# Patient Record
Sex: Male | Born: 1950 | Race: Black or African American | Hispanic: No | Marital: Married | Smoking: Never smoker
Health system: Southern US, Community
[De-identification: ages and names within clinical notes are randomized; demographics above are authoritative.]

## PROBLEM LIST (undated history)

## (undated) DIAGNOSIS — I10 Essential (primary) hypertension: Secondary | ICD-10-CM

## (undated) DIAGNOSIS — K219 Gastro-esophageal reflux disease without esophagitis: Secondary | ICD-10-CM

## (undated) DIAGNOSIS — M1990 Unspecified osteoarthritis, unspecified site: Secondary | ICD-10-CM

## (undated) HISTORY — PX: HERNIA REPAIR: SHX51

---

## 2004-10-05 ENCOUNTER — Emergency Department (HOSPITAL_COMMUNITY): Admission: EM | Admit: 2004-10-05 | Discharge: 2004-10-05 | Payer: Self-pay | Admitting: Emergency Medicine

## 2006-05-14 ENCOUNTER — Encounter: Admission: RE | Admit: 2006-05-14 | Discharge: 2006-05-14 | Payer: Self-pay | Admitting: Family Medicine

## 2008-02-15 ENCOUNTER — Inpatient Hospital Stay (HOSPITAL_COMMUNITY): Admission: EM | Admit: 2008-02-15 | Discharge: 2008-02-16 | Payer: Self-pay | Admitting: Emergency Medicine

## 2008-02-16 ENCOUNTER — Encounter (INDEPENDENT_AMBULATORY_CARE_PROVIDER_SITE_OTHER): Payer: Self-pay | Admitting: *Deleted

## 2009-05-16 IMAGING — CR DG CHEST 2V
2 series · 2 of 2 positions shown · non-contrast
Comparison: None

CLINICAL DATA: Chest pain

CHEST - 2 VIEW

[view not recorded (1 of 2)]
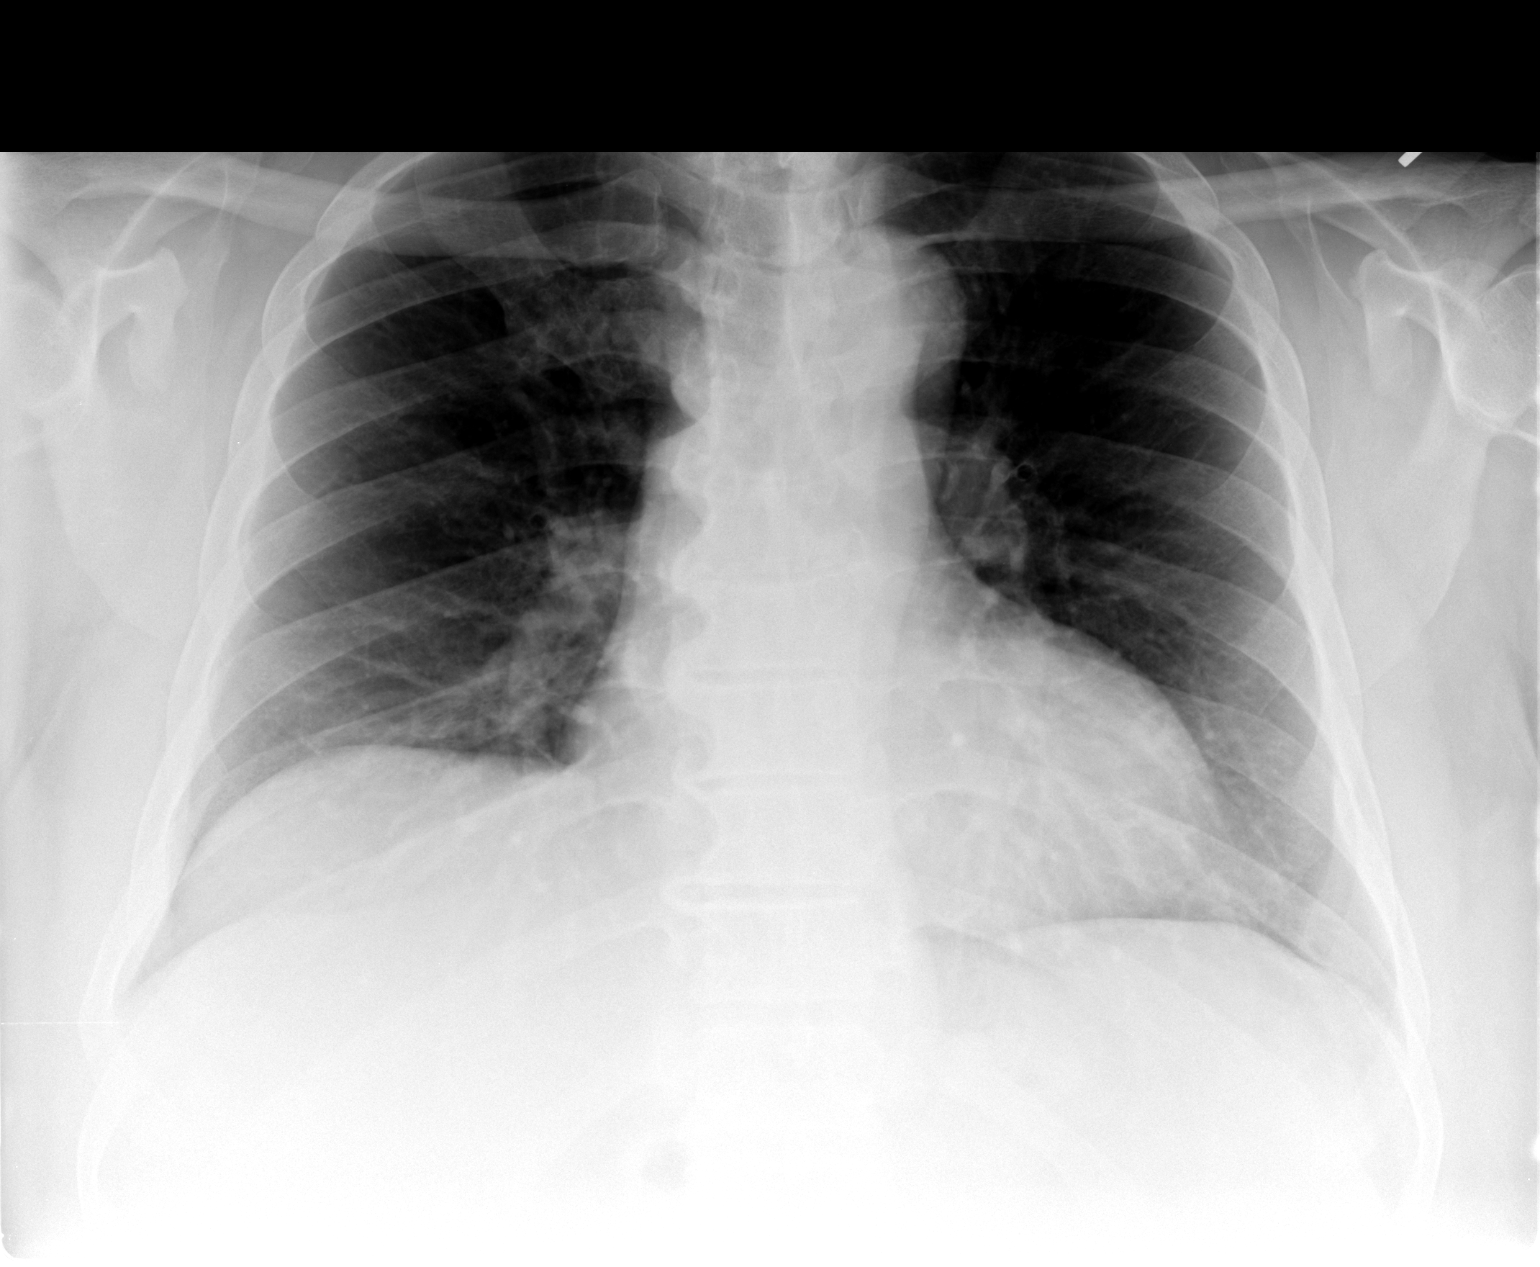

[view not recorded (2 of 2)]
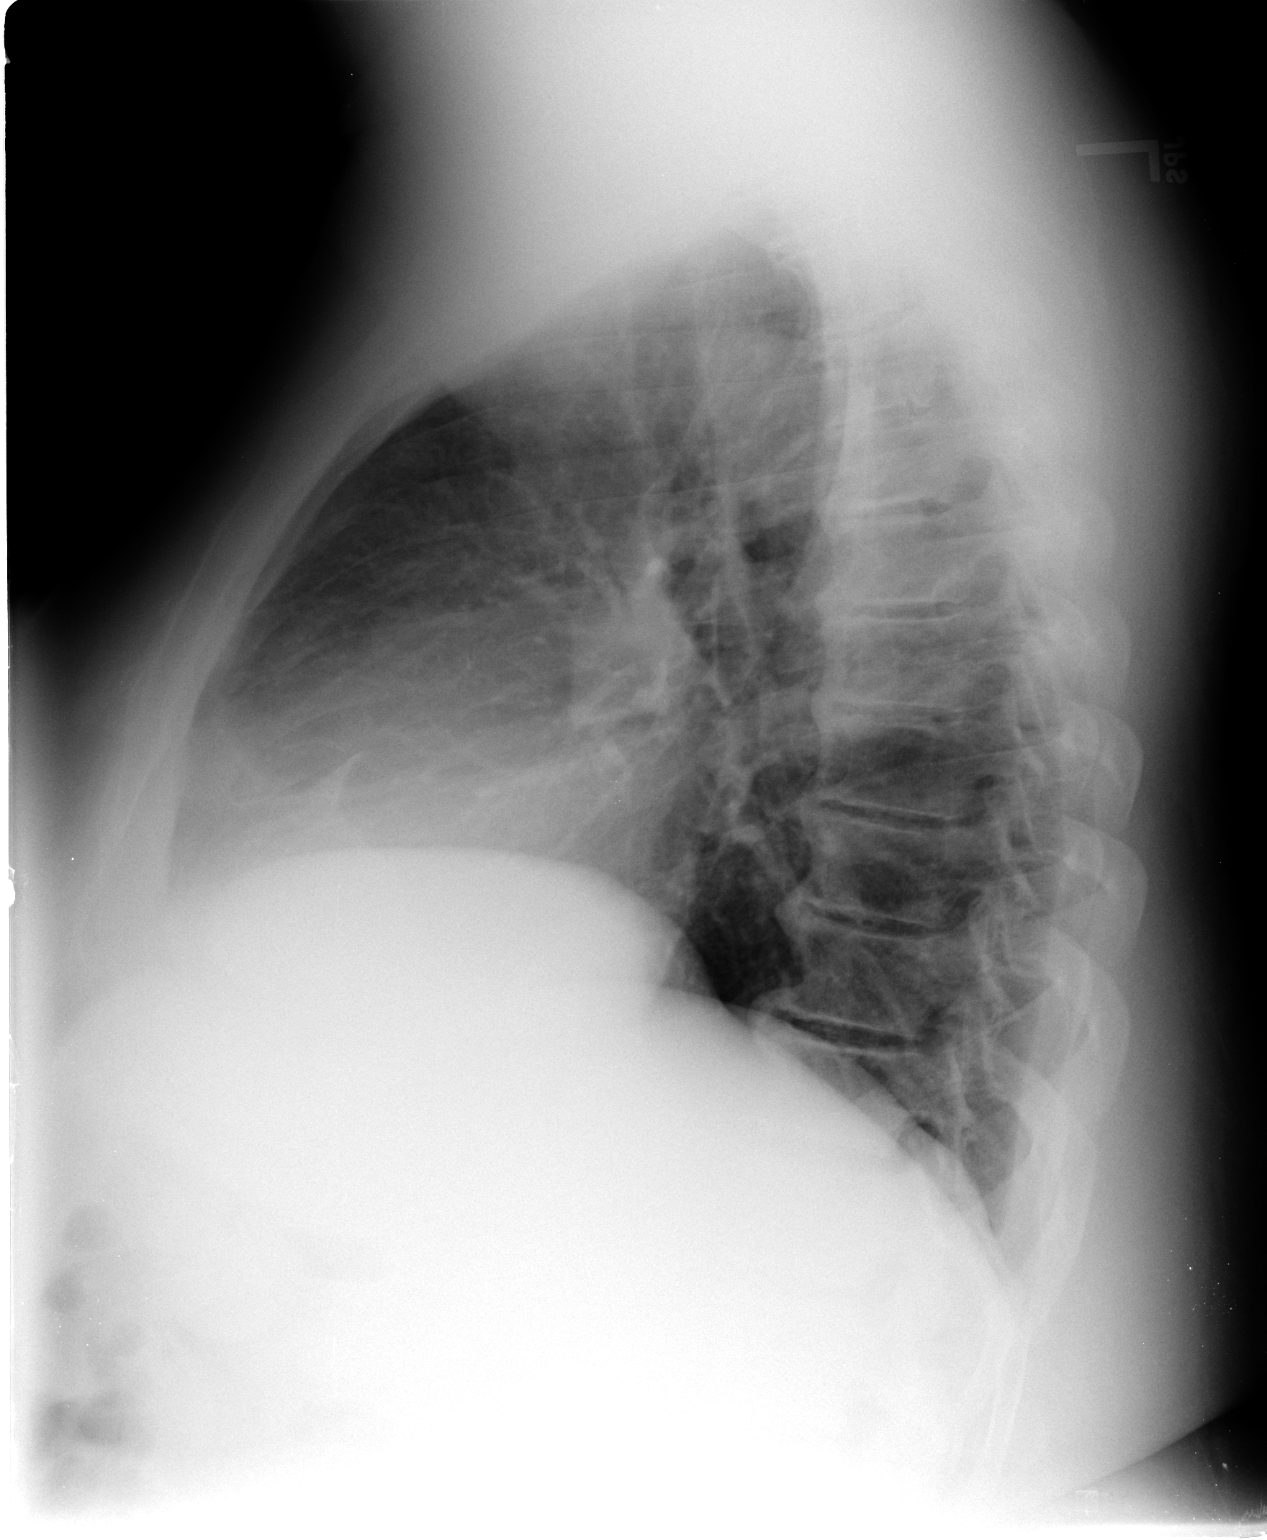

[2 of 2 positions shown; findings below may reference images not displayed]

FINDINGS: There is focal eventration of the anterior right
hemidiaphragm.  The lungs are clear and show no evidence of
infiltrate or edema.  No pleural effusions identified.  The heart
size is normal.

Degenerative changes are present throughout the thoracic spine.
IMPRESSION: No active disease.

## 2009-11-19 ENCOUNTER — Emergency Department (HOSPITAL_COMMUNITY): Admission: EM | Admit: 2009-11-19 | Discharge: 2009-11-19 | Payer: Self-pay | Admitting: Emergency Medicine

## 2010-07-04 ENCOUNTER — Emergency Department (HOSPITAL_COMMUNITY): Admission: EM | Admit: 2010-07-04 | Discharge: 2010-07-04 | Payer: Self-pay | Admitting: Emergency Medicine

## 2010-09-15 ENCOUNTER — Encounter: Payer: Self-pay | Admitting: Family Medicine

## 2010-11-17 LAB — POCT I-STAT, CHEM 8
Calcium, Ion: 1.19 mmol/L (ref 1.12–1.32)
Glucose, Bld: 99 mg/dL (ref 70–99)
HCT: 46 % (ref 39.0–52.0)
Hemoglobin: 15.6 g/dL (ref 13.0–17.0)
Potassium: 4.4 mEq/L (ref 3.5–5.1)

## 2010-11-17 LAB — POCT CARDIAC MARKERS
CKMB, poc: 1.4 ng/mL (ref 1.0–8.0)
Myoglobin, poc: 155 ng/mL (ref 12–200)
Troponin i, poc: <0.05 ng/mL (ref 0.00–0.09)

## 2010-11-17 LAB — CBC
Platelets: 227 10*3/uL (ref 150–400)
RDW: 16.2 % — ABNORMAL HIGH (ref 11.5–15.5)
WBC: 4.4 10*3/uL (ref 4.0–10.5)

## 2010-11-17 LAB — DIFFERENTIAL
Basophils Absolute: 0 10*3/uL (ref 0.0–0.1)
Lymphocytes Relative: 32 % (ref 12–46)
Neutro Abs: 2.4 10*3/uL (ref 1.7–7.7)

## 2011-01-07 NOTE — H&P (Signed)
[name], [name] NO.:  [id number]   MEDICAL RECORD NO.:  [id number]          PATIENT TYPE:  INP   LOCATION:  3706                         FACILITY:  [facility]   PHYSICIAN:  Hillery Aldo, M.D.   DATE OF BIRTH:  02/09/1951   DATE OF ADMISSION:  02/15/2008  DATE OF DISCHARGE:                              HISTORY & PHYSICAL   PRIMARY CARE PHYSICIAN:  Karyl Kinnier   CHIEF COMPLAINT:  Chest pain.   HISTORY OF PRESENT ILLNESS:  The patient is a 60 year old male with a  past medical history of hypertension who presents to the hospital with a  chief complaint of intermittent chest pain for the past 24 hours.  Pain  started yesterday morning and worsened when he was active.  Specifically, the pain got worse when he was mowing his yard.  Pain is  substernal and occasionally radiates down the left arm.  He rates his  pain had at a level 6/10 at its worst and pressure-like in quality.  Pain has been associated with some shortness of breath, but no frank  nausea, vomiting or diaphoresis.  He has never had pain like this  before.  Because of his risk factor profile including male sex, obesity,  family history, and questionable lipid status, he is being admitted to  the hospital for further evaluation and workup.   PAST MEDICAL HISTORY:  1. Hypertension.  2. Anxiety disorder.  3. Gastroesophageal reflux.  4. Obstructive sleep apnea (is supposed to wear CPAP nocturnally, but      does not do this).   PAST SURGICAL HISTORY:  Herniorrhaphy.   FAMILY HISTORY:  The patient's mother died at age 24 from an acute MI.  She also was diabetic and had chronic kidney disease.  The patient's  father died at 33 from prostate cancer.  The patient has multiple  siblings, 3 of whom are deceased.  One died of an aneurysm and 2 died  with cancer of unspecified type.   SOCIAL HISTORY:  The patient is married.  He has a remote history of  social tobacco use in his teens.  He denies  alcohol or drug use.  He  works as an [employer] manager.   ALLERGIES:  No known drug allergies.   MEDICATIONS:  1. Lexapro 10 mg daily.  2. Nexium 40 mg daily.  3. Hydrochlorothiazide (has not taken in 3 months' time).   REVIEW OF SYSTEMS:  The patient endorses polyuria and polydipsia.  Otherwise a 14-point comprehensive review of systems is negative.   PHYSICAL EXAM:  VITAL SIGNS:  Temperature 97.1, pulse 55, respirations  22, blood pressure 136/83, O2 saturation 99% on 2 liters.  GENERAL:  This is an obese male who is in no acute distress.  HEENT:  Normocephalic, atraumatic.  PERRL.  EOMI.  OROPHARYNX:  Clear.  NECK:  Supple, no thyromegaly, no lymphadenopathy, no jugular venous  distention.  CHEST:  Lungs are clear to auscultation bilaterally with good air  movement.  No wheeze.  HEART:  Distant heart sounds, but regular.  No murmurs, rubs, or gallops  appreciated.  ABDOMEN:  Soft, nontender, nondistended with normoactive bowel sounds.  EXTREMITIES:  Trace edema bilaterally.  SKIN:  The patient has acanthosis nigricans around his neck area.  NEUROLOGIC:  The patient is alert and oriented x3.  Cranial nerves II-  XII grossly intact.  Nonfocal.   DATA REVIEW:  Chest x-ray shows no active disease.   LABORATORY DATA:  White blood cell count is 3.4, hemoglobin 14.6,  hematocrit 42.9, platelets 221.  Sodium is 137, potassium 4.2, chloride  106, bicarb 24, BUN 9, creatinine 0.97, glucose 102, total bilirubin  0.6, alkaline phosphatase 54, AST 26, ALT 22, total protein 6.8, albumin  3.9.  Point-of-care cardiac markers are negative x2.   ASSESSMENT/PLAN:  1. Chest pain:  We need to admit the patient to rule out acute      coronary syndrome given his risk factor profile.  We will admit him      to the telemetry floor and cycle cardiac enzymes q.8 h. x3.  We      will also check a 12-lead EKG in the morning.  We will consult      cardiology as the patient will likely need  stress testing versus a      heart catheterization.  2. Morbid obesity:  The patient will receive a dietician consult for      weight loss instruction.  We will put him on a heart healthy diet.  3. History of hypertension:  The patient's blood pressure is currently      under control.  We will monitor his blood pressures and if      indicated initiate therapy.  4. Anxiety disorder:  We will continue the patient's Lexapro.  5. Gastroesophageal reflux disease:  Continue proton pump inhibitor      therapy.  6. Acanthosis nigricans:  The patient's obesity certainly puts him at      risk for glucose intolerance.  We will check a hemoglobin A1c value      and if this is elevated, monitor his glycemic control and consider      treatment.  7. Prophylaxis:  Initiate GI prophylaxis with a proton pump inhibitor      and DVT prophylaxis with subcutaneous heparin.      Hillery Aldo, M.D.  Electronically Signed     CR/MEDQ  D:  02/15/2008  T:  02/16/2008  Job:  [id number]   cc:   Karyl Kinnier

## 2011-01-07 NOTE — Consult Note (Signed)
[name], TANIMOTO NO.:  [id number]   MEDICAL RECORD NO.:  [id number]          PATIENT TYPE:  INP   LOCATION:  3706                         FACILITY:  [facility]   PHYSICIAN:  Elmore Guise., M.D.DATE OF BIRTH:  [birth year]   DATE OF CONSULTATION:  02/15/2008  DATE OF DISCHARGE:                                 CONSULTATION   INDICATION FOR CONSULT:  Chest pain.   CONSULTING PHYSICIAN:  Dr. Trula Ore Rama.   HISTORY OF PRESENT ILLNESS:  Mr. Blasingame is a very pleasant 60 year old  African American male, past medical history of morbid obesity,  obstructive sleep apnea, borderline hypertension and dyslipidemia, who  presents for evaluation of chest discomfort.  The patient reports he has  had a long history of hiatal hernia and stomach problems.  This  typically causes him off-and-on chest pain.  He states when his hiatal  hernia acts up it will cause him some chest pressure.  This can happen  with activity and without activity.  He recently started an exercise  program 2 weeks ago to help with conditioning as well as with weight  loss.  Initially he reported no problems.  He could walk around the  block without any significant chest pain or shortness of breath;  however, last night, the patient started having substernal chest  discomfort which he described as a pressure.  This happened both with  and without activity.  He initially thought it was from his stomach.  He  went and mowed the yard.  After finishing mowing the yard, his chest  pressure continued for approximately 1 hour.  It then stopped  spontaneously.  He does state that while he was walking and mowing the  yard he denies any significant exertional worsening.  This morning he  awoke, he had recurrence of his chest pressure and tightness.  He denies  any significant shortness of breath.  No nausea, no diaphoresis.  Because of his symptoms, he then came to the emergency room for further  evaluation.  He  denies difficulty with productive cough or orthopnea.  He does have mild lower extremity edema.  He has been on  hydrochlorothiazide in the past; however, he is currently not taking.  He has had no abdominal pain, melena or hematochezia.   REVIEW OF SYSTEMS:  As per HPI.  All others are negative.   CURRENT MEDICATIONS:  Lexapro and Nexium.   ALLERGIES:  None.   FAMILY HISTORY:  Positive for heart disease with his mother, who died  from a heart attack in her early 26s.   SOCIAL HISTORY:  He is married.  He denies any current tobacco or  alcohol use.   PHYSICAL EXAMINATION:  VITAL SIGNS:  He is afebrile.  Blood pressure  initially in the emergency room 135/90, however, now 170/90.  Heart rate  is 64.  The O2 sat 96% on room air.  GENERAL:  He is a very pleasant middle-aged [place] American male, alert  and oriented x 4, in no acute distress.  He is morbidly obese, and he  estimates his weight somewhere  in the 375 to 380 range.  NECK:  Supple.  No lymphadenopathy.  There are 2+ carotids.  No JVD.  No  bruits.  No thyromegaly.  He does have acanthosis nigricans noted on his  neck.  LUNGS:  Clear.  HEART:  Heart is regular with normal S1, S2.  No significant murmurs,  gallops or rubs.  ABDOMEN:  Obese, soft, nontender, nondistended.  No rebound or guarding.  EXTREMITIES:  Warm with 2+ pulses and trace pretibial edema.  NEUROLOGIC:  Cranial nerves are intact with no focal deficits.   His blood work shows a white count of 3.4, hemoglobin of 14.6, platelet  count of 221.  His coags are normal.  His BUN and creatinine are 9 and  0.9.  Potassium level 4.2.  LFTs are normal.  Enzymes are negative with  myoglobin 134 and 117, MB of 2.5 and 2.7, troponin I less than 0.05 x2.  Chest x-ray shows no acute cardiopulmonary disease, and ECG shows sinus  bradycardia, rate of 56 per minute, with no ST/T wave changes.  A normal-  appearing tracing.   IMPRESSION:  1. Chest discomfort.  2. History  of gastroesophageal reflux disease and hiatal hernia.  3. Hypertension.  4. Family history of coronary disease.  5. Morbid obesity.   PLAN:  Currently the patient is chest pain free.  His enzymes and ECG  are normal.  His size precludes stress test evaluation because of  subcutaneous artifact.  Therefore, at this time, I would recommend  aggressive medical therapy.  I would check serial cardiac enzymes, have  aggressive blood pressure control with goal blood pressure less than  130/85.  I do agree he needs hemoglobin A1c, fasting lipids, TSH for  further evaluation of other comorbid medical issues.  I also recommend  checking an echo.  If his echo shows normal LV function, I will continue  aggressive medical therapy, if abnormal, then possible cath; however,  his catheterization would have to be an arm case because of his size.  I  did discuss these issues and limitations with him and his wife at  length.  Further recommendations pending his blood work and echo.   Thanks for the opportunity to participate in his care.      Elmore Guise., M.D.  Electronically Signed     TWK/MEDQ  D:  02/15/2008  T:  02/15/2008  Job:  [id number]

## 2011-03-06 ENCOUNTER — Emergency Department (HOSPITAL_COMMUNITY): Payer: Self-pay

## 2011-03-06 ENCOUNTER — Emergency Department (HOSPITAL_COMMUNITY)
Admission: EM | Admit: 2011-03-06 | Discharge: 2011-03-06 | Disposition: A | Discharge status: Home / Self Care | Payer: Self-pay | Attending: Emergency Medicine | Admitting: Emergency Medicine

## 2011-03-06 DIAGNOSIS — I10 Essential (primary) hypertension: Secondary | ICD-10-CM | POA: Insufficient documentation

## 2011-03-06 DIAGNOSIS — R072 Precordial pain: Secondary | ICD-10-CM | POA: Insufficient documentation

## 2011-03-06 DIAGNOSIS — K219 Gastro-esophageal reflux disease without esophagitis: Secondary | ICD-10-CM | POA: Insufficient documentation

## 2011-03-06 DIAGNOSIS — I498 Other specified cardiac arrhythmias: Secondary | ICD-10-CM | POA: Insufficient documentation

## 2011-03-06 LAB — DIFFERENTIAL
Basophils Relative: 0 % (ref 0–1)
Eosinophils Absolute: 0 10*3/uL (ref 0.0–0.7)
Monocytes Relative: 6 % (ref 3–12)
Neutrophils Relative %: 81 % — ABNORMAL HIGH (ref 43–77)

## 2011-03-06 LAB — TROPONIN I: Troponin I: <0.3 ng/mL (ref <0.30)

## 2011-03-06 LAB — POCT I-STAT, CHEM 8
Calcium, Ion: 1.14 mmol/L (ref 1.12–1.32)
HCT: 45 % (ref 39.0–52.0)
Hemoglobin: 15.3 g/dL (ref 13.0–17.0)
TCO2: 24 mmol/L (ref 0–100)

## 2011-03-06 LAB — CBC
MCH: 24.6 pg — ABNORMAL LOW (ref 26.0–34.0)
Platelets: 209 10*3/uL (ref 150–400)
RBC: 5.69 MIL/uL (ref 4.22–5.81)
WBC: 5.4 10*3/uL (ref 4.0–10.5)

## 2011-03-06 LAB — CK TOTAL AND CKMB (NOT AT ARMC): Total CK: 193 U/L (ref 7–232)

## 2011-05-22 LAB — CBC
HCT: 42.9
Platelets: 221
RDW: 16.6 — ABNORMAL HIGH

## 2011-05-22 LAB — COMPREHENSIVE METABOLIC PANEL
Albumin: 3.9
BUN: 9
Creatinine, Ser: 0.97
Potassium: 4.2
Total Protein: 6.8

## 2011-05-22 LAB — TSH: TSH: 1.723

## 2011-05-22 LAB — DIFFERENTIAL
Lymphocytes Relative: 27
Lymphs Abs: 0.9
Monocytes Absolute: 0.3
Monocytes Relative: 8
Neutro Abs: 2.1

## 2011-05-22 LAB — HEMOGLOBIN A1C
Hgb A1c MFr Bld: 5.8
Mean Plasma Glucose: 129

## 2011-05-22 LAB — LIPID PANEL
Cholesterol: 214 — ABNORMAL HIGH
HDL: 38 — ABNORMAL LOW
Total CHOL/HDL Ratio: 5.6
Triglycerides: 153 — ABNORMAL HIGH

## 2011-05-22 LAB — CARDIAC PANEL(CRET KIN+CKTOT+MB+TROPI): Relative Index: 1.4

## 2011-05-22 LAB — POCT CARDIAC MARKERS
Myoglobin, poc: 134
Operator id: 284251
Operator id: 284251
Troponin i, poc: <0.05

## 2011-10-03 IMAGING — CR DG SHOULDER 2+V*R*
3 series · 3 of 3 positions shown · non-contrast
Comparison: None.

CLINICAL DATA: Right shoulder pain for 1 month.  No known injury.

RIGHT SHOULDER - 2+ VIEW

[w shoulder ap internal righ]
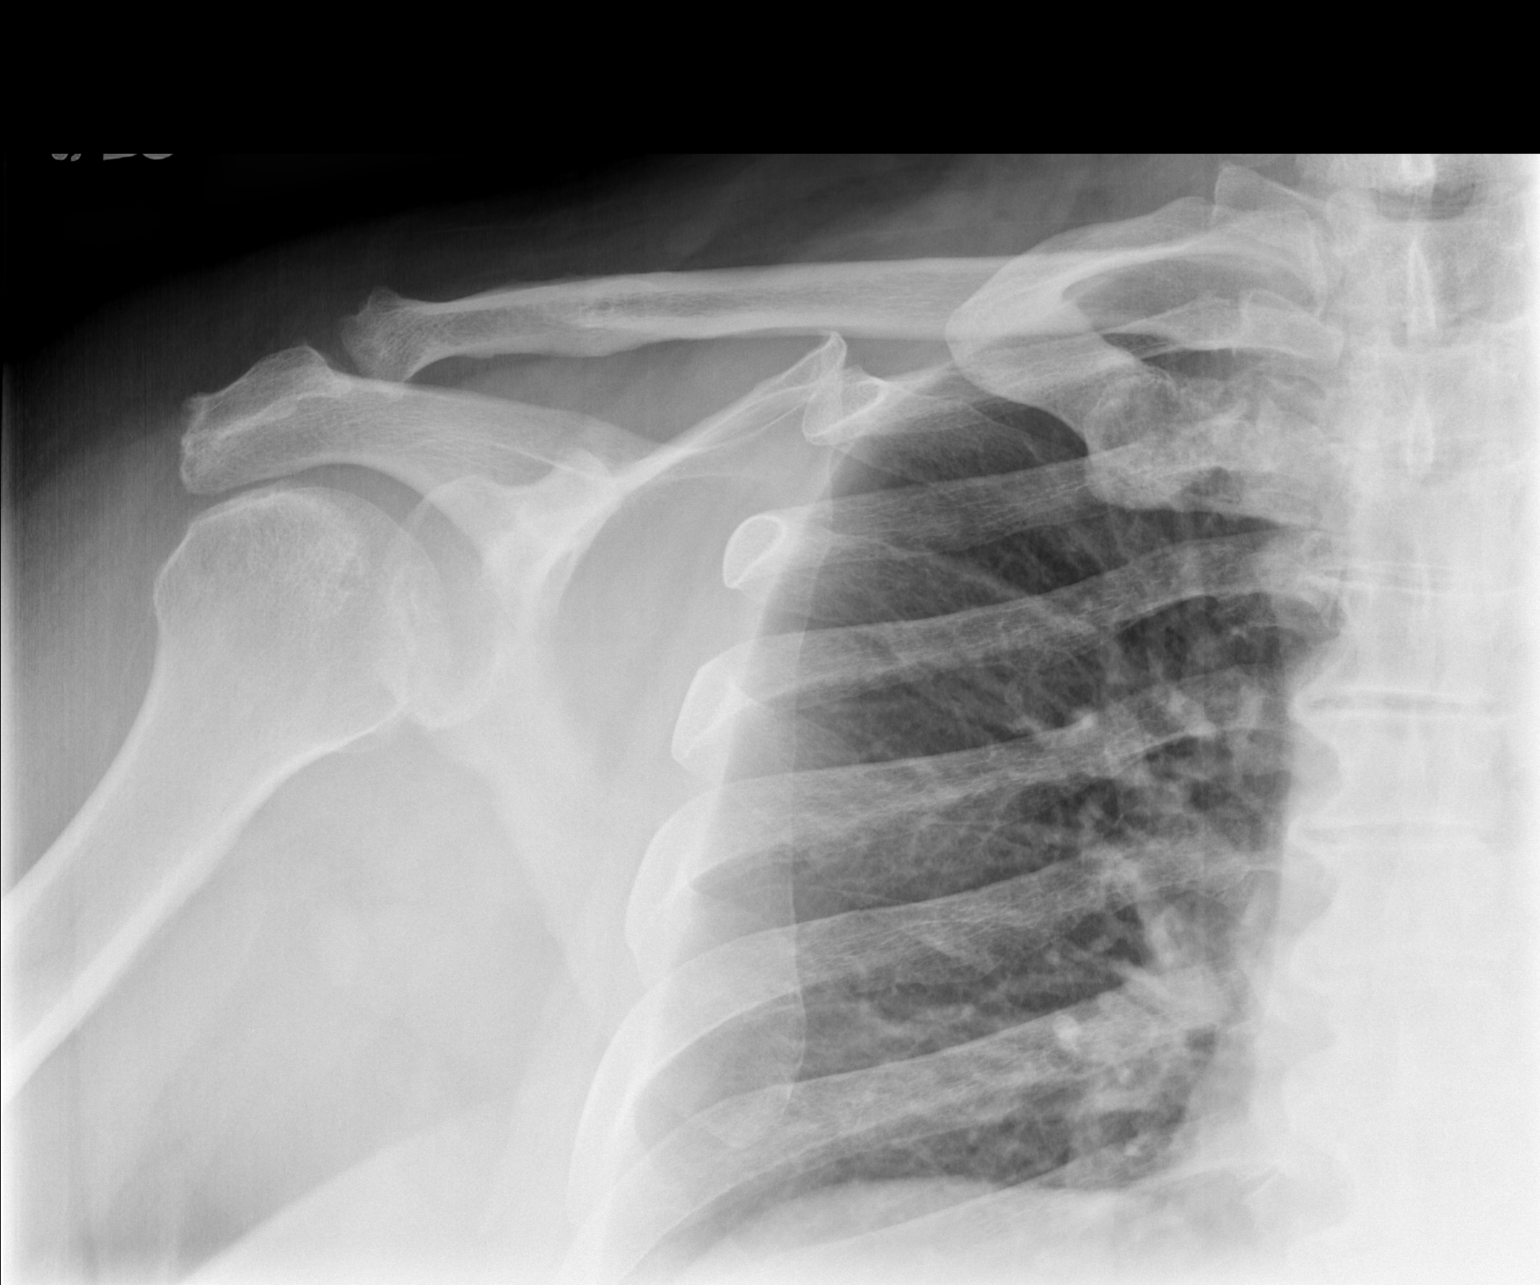

[w shoulder ap external righ]
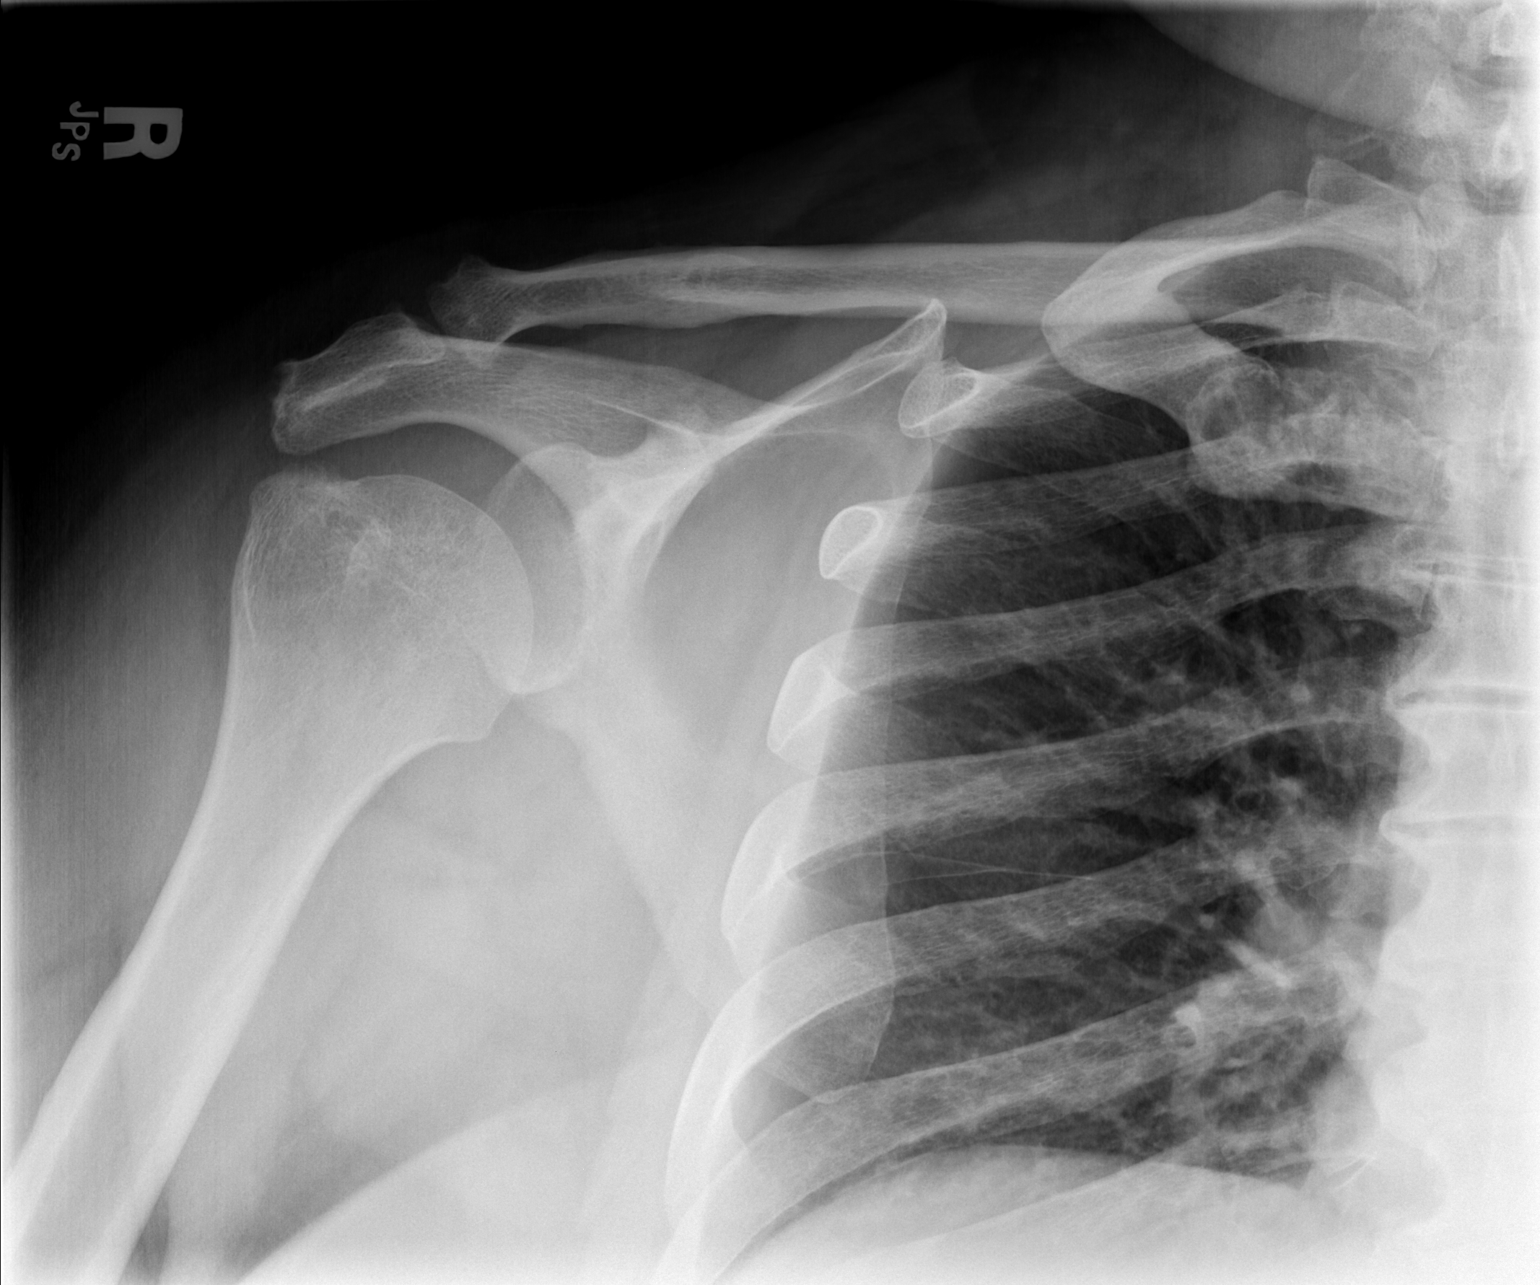

[w shoulder y view right *]
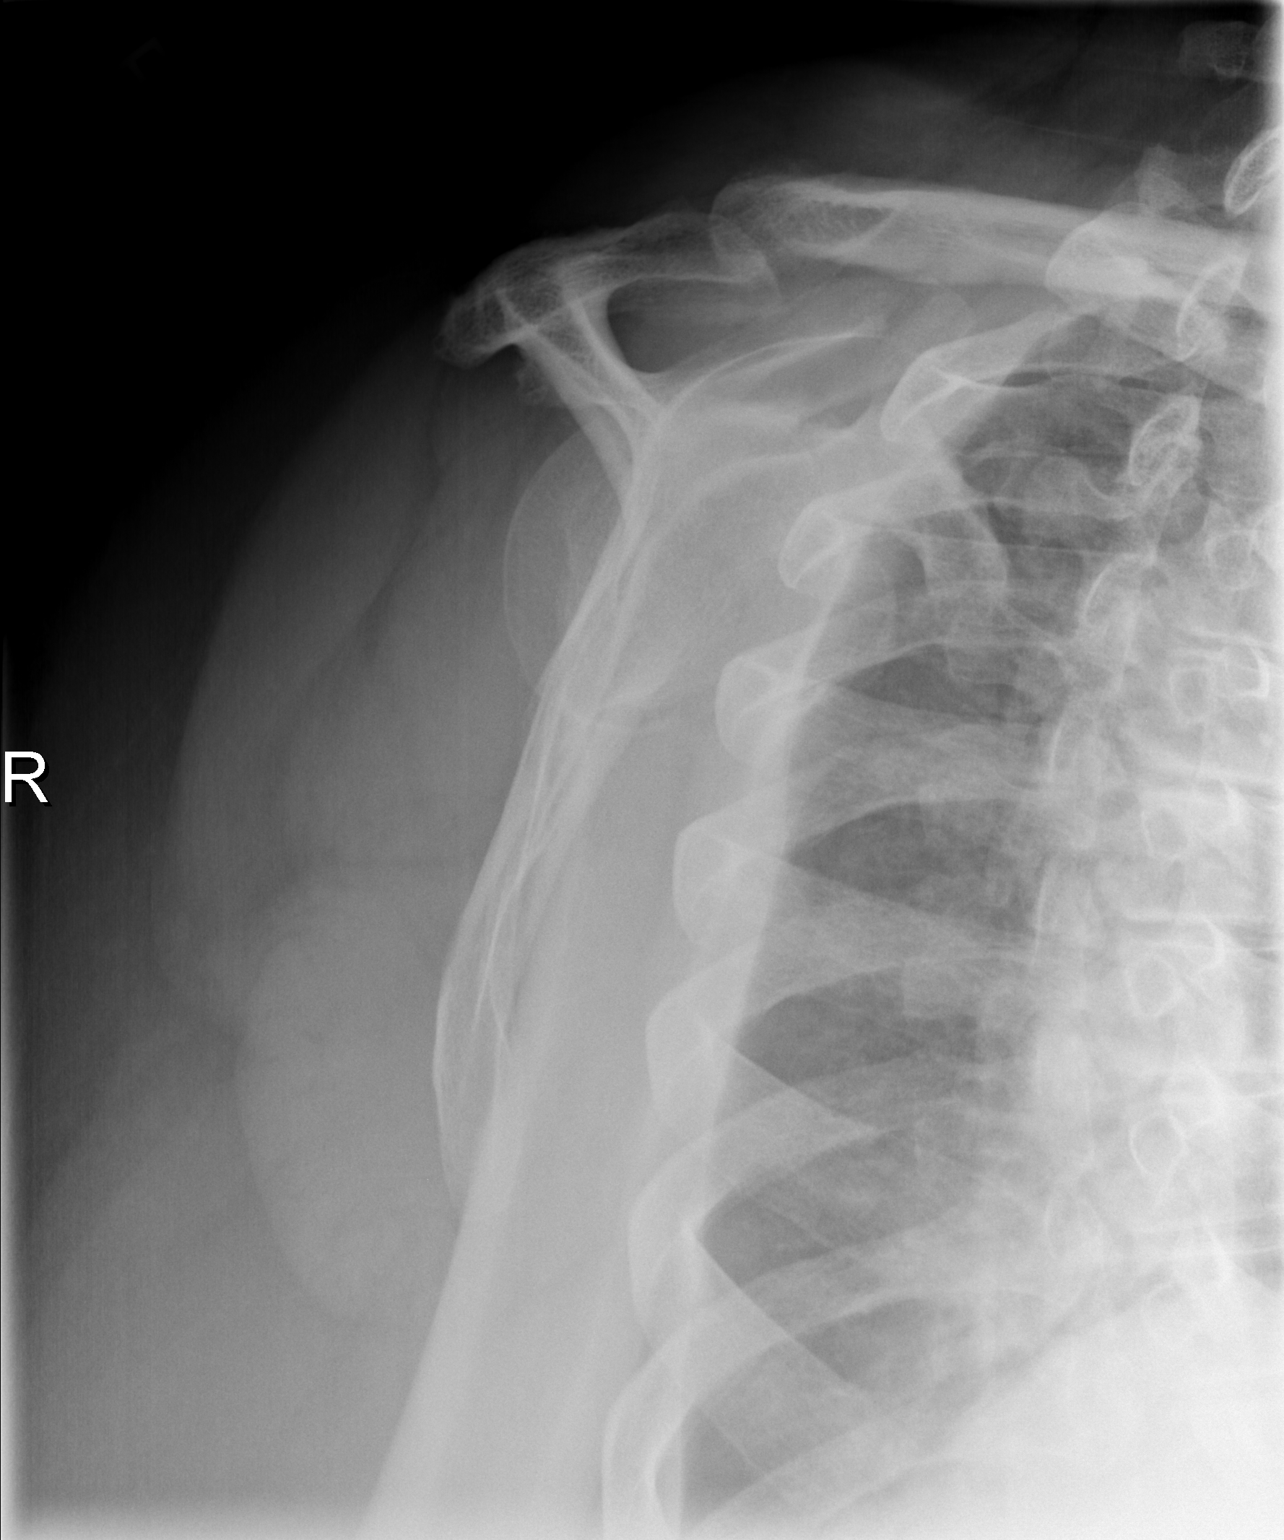

[3 of 3 positions shown; findings below may reference images not displayed]

FINDINGS: Mineralization and alignment are normal.  There is no
evidence of acute fracture or dislocation.  There are mild
glenohumeral and acromioclavicular degenerative changes.  In
addition, there is spurring at the greater tuberosity.  The
subacromial space appears preserved.
IMPRESSION: No acute osseous findings.  Degenerative changes as described.

## 2012-02-16 ENCOUNTER — Encounter (HOSPITAL_COMMUNITY): Payer: Self-pay | Admitting: *Deleted

## 2012-02-16 DIAGNOSIS — M25569 Pain in unspecified knee: Secondary | ICD-10-CM | POA: Insufficient documentation

## 2012-02-16 DIAGNOSIS — M129 Arthropathy, unspecified: Secondary | ICD-10-CM | POA: Insufficient documentation

## 2012-02-16 DIAGNOSIS — I10 Essential (primary) hypertension: Secondary | ICD-10-CM | POA: Insufficient documentation

## 2012-02-16 NOTE — ED Notes (Signed)
Lt knee pain for 3 days.  He has previous pain for a long time

## 2012-02-17 ENCOUNTER — Emergency Department (HOSPITAL_COMMUNITY)
Admission: EM | Admit: 2012-02-17 | Discharge: 2012-02-17 | Disposition: A | Discharge status: Home / Self Care | Payer: Self-pay | Attending: Emergency Medicine | Admitting: Emergency Medicine

## 2012-02-17 DIAGNOSIS — I10 Essential (primary) hypertension: Secondary | ICD-10-CM

## 2012-02-17 DIAGNOSIS — M1990 Unspecified osteoarthritis, unspecified site: Secondary | ICD-10-CM

## 2012-02-17 DIAGNOSIS — M25569 Pain in unspecified knee: Secondary | ICD-10-CM

## 2012-02-17 HISTORY — DX: Essential (primary) hypertension: I10

## 2012-02-17 MED ORDER — LISINOPRIL 20 MG PO TABS: 20.0000 mg | ORAL_TABLET | Freq: Every day | ORAL | Status: DC

## 2012-02-17 MED ORDER — TRAMADOL HCL 50 MG PO TABS: 50.0000 mg | ORAL_TABLET | Freq: Four times a day (QID) | ORAL | Status: AC | PRN

## 2012-02-17 MED ORDER — PREDNISONE 20 MG PO TABS: 40.0000 mg | ORAL_TABLET | Freq: Every day | ORAL | Status: DC

## 2012-02-17 NOTE — ED Provider Notes (Signed)
History     CSN: [id number]  Arrival date & time 02/16/12  2106   First MD Initiated Contact with Patient 02/17/12 0056      Chief Complaint  Patient presents with  . Knee Pain    HPI  History provided by the patient. Patient is a 61 year old male with history of hypertension who presents with complaints of increasing left knee pains. Patient states pain has been increasing gradually over the past 3 days. Patient denies any injury or trauma to the knee. Patient reports having history of bilateral knee problems off and on for the past several years. Patient states he was told that he has no cartilage in his knee and was previously receiving local steroid injections before he lost medical insurance one year ago. Patient also states that he was considering having knee replacement surgery with his doctors. Patient has been trying to lose weight to help with symptoms. Symptoms are associated with some swelling to the knee. He denies any other aggravating or alleviating factors. He denies any erythema of the skin. Denies any fever, chills, sweats. Denies any numbness or weakness in lower leg or foot.     Past Medical History  Diagnosis Date  . Hypertension     History reviewed. No pertinent past surgical history.  No family history on file.  History  Substance Use Topics  . Smoking status: Never Smoker   . Smokeless tobacco: Not on file  . Alcohol Use: No      Review of Systems  Constitutional: Negative for fever and chills.  Musculoskeletal: Positive for joint swelling. Negative for back pain.       Knee pain  Neurological: Negative for weakness and numbness.    Allergies  Review of patient's allergies indicates no known allergies.  Home Medications   Current Outpatient Rx  Name Route Sig Dispense Refill  . ESCITALOPRAM OXALATE 10 MG PO TABS Oral Take 10 mg by mouth daily.    Marland Kitchen ESOMEPRAZOLE MAGNESIUM 40 MG PO CPDR Oral Take 40 mg by mouth every other day.    Marland Kitchen  NAPROXEN SODIUM 220 MG PO TABS Oral Take 440 mg by mouth daily as needed. For pain    . TRAMADOL HCL 50 MG PO TABS Oral Take 50 mg by mouth 2 (two) times daily.      BP 172/93  Pulse 81  Temp 98.1 F (36.7 C) (Oral)  Resp 19  SpO2 98%  Physical Exam  Nursing note and vitals reviewed. Constitutional: He is oriented to person, place, and time. He appears well-developed and well-nourished.       Morbidly obese  HENT:  Head: Normocephalic.  Cardiovascular: Normal rate and regular rhythm.   Pulmonary/Chest: Effort normal and breath sounds normal.  Abdominal: Soft.  Musculoskeletal:       Exam is limited secondary to body habitus. There appears to be mild swelling to left knee compared to right. There is tenderness to palpation over anterior and inferior knee. No appreciable increased laxity with valgus or varus stress. Negative anterior posterior drawer test.  Neurological: He is alert and oriented to person, place, and time.  Skin: Skin is warm. No erythema.  Psychiatric: He has a normal mood and affect. His behavior is normal.    ED Course  Procedures      1. Knee pain   2. Arthritis   3. Hypertension       MDM  Patient seen and evaluated. Patient in no acute distress.  Angus Seller, [place] 02/18/12 [phone]

## 2012-02-17 NOTE — ED Notes (Signed)
Pt states he has been told my MD that he needs to have a bil knee replacement d/t loss of cartilage b/w the joint spaces.  However, he needs to lose 100 lbs and had been unable to do so.  He lost his insurance and is trying to get into [facility].  Pain increasing.

## 2012-02-17 NOTE — Discharge Instructions (Signed)
You were seen and evaluated today for your symptoms of knee pains. You have been given prescriptions for medicines to help with your symptoms. Please continue to use rest, ice, compression and elevation to reduce pain and swelling. Please followup to primary care provider for continued evaluation of your symptoms and general medical care.   Arthritis, Nonspecific Arthritis is inflammation of a joint. This usually means pain, redness, warmth or swelling are present. One or more joints may be involved. There are a number of types of arthritis. Your caregiver may not be able to tell what type of arthritis you have right away. CAUSES  The most common cause of arthritis is the wear and tear on the joint (osteoarthritis). This causes damage to the cartilage, which can break down over time. The knees, hips, back and neck are most often affected by this type of arthritis. Other types of arthritis and common causes of joint pain include:  Sprains and other injuries near the joint. Sometimes minor sprains and injuries cause pain and swelling that develop hours later.   Rheumatoid arthritis. This affects hands, feet and knees. It usually affects both sides of your body at the same time. It is often associated with chronic ailments, fever, weight loss and general weakness.   Crystal arthritis. Gout and pseudo gout can cause occasional acute severe pain, redness and swelling in the foot, ankle, or knee.   Infectious arthritis. Bacteria can get into a joint through a break in overlying skin. This can cause infection of the joint. Bacteria and viruses can also spread through the blood and affect your joints.   Drug, infectious and allergy reactions. Sometimes joints can become mildly painful and slightly swollen with these types of illnesses.  SYMPTOMS   Pain is the main symptom.   Your joint or joints can also be red, swollen and warm or hot to the touch.   You may have a fever with certain types of  arthritis, or even feel overall ill.   The joint with arthritis will hurt with movement. Stiffness is present with some types of arthritis.  DIAGNOSIS  Your caregiver will suspect arthritis based on your description of your symptoms and on your exam. Testing may be needed to find the type of arthritis:  Blood and sometimes urine tests.   X-ray tests and sometimes CT or MRI scans.   Removal of fluid from the joint (arthrocentesis) is done to check for bacteria, crystals or other causes. Your caregiver (or a specialist) will numb the area over the joint with a local anesthetic, and use a needle to remove joint fluid for examination. This procedure is only minimally uncomfortable.   Even with these tests, your caregiver may not be able to tell what kind of arthritis you have. Consultation with a specialist (rheumatologist) may be helpful.  TREATMENT  Your caregiver will discuss with you treatment specific to your type of arthritis. If the specific type cannot be determined, then the following general recommendations may apply. Treatment of severe joint pain includes:  Rest.   Elevation.   Anti-inflammatory medication (for example, ibuprofen) may be prescribed. Avoiding activities that cause increased pain.   Only take over-the-counter or prescription medicines for pain and discomfort as recommended by your caregiver.   Cold packs over an inflamed joint may be used for 10 to 15 minutes every hour. Hot packs sometimes feel better, but do not use overnight. Do not use hot packs if you are diabetic without your caregiver's permission.  A cortisone shot into arthritic joints may help reduce pain and swelling.   Any acute arthritis that gets worse over the next 1 to 2 days needs to be looked at to be sure there is no joint infection.  Long-term arthritis treatment involves modifying activities and lifestyle to reduce joint stress jarring. This can include weight loss. Also, exercise is needed  to nourish the joint cartilage and remove waste. This helps keep the muscles around the joint strong. HOME CARE INSTRUCTIONS   Do not take aspirin to relieve pain if gout is suspected. This elevates uric acid levels.   Only take over-the-counter or prescription medicines for pain, discomfort or fever as directed by your caregiver.   Rest the joint as much as possible.   If your joint is swollen, keep it elevated.   Use crutches if the painful joint is in your leg.   Drinking plenty of fluids may help for certain types of arthritis.   Follow your caregiver's dietary instructions.   Try low-impact exercise such as:   Swimming.   Water aerobics.   Biking.   Walking.   Morning stiffness is often relieved by a warm shower.   Put your joints through regular range-of-motion.  SEEK MEDICAL CARE IF:   You do not feel better in 24 hours or are getting worse.   You have side effects to medications, or are not getting better with treatment.  SEEK IMMEDIATE MEDICAL CARE IF:   You have a fever.   You develop severe joint pain, swelling or redness.   Many joints are involved and become painful and swollen.   There is severe back pain and/or leg weakness.   You have loss of bowel or bladder control.  Document Released: 09/18/2004 Document Revised: 07/31/2011 Document Reviewed: 10/04/2008 [facility] Patient Information 2012 [place], [place].     Hypertension Information As your heart beats, it forces blood through your arteries. This force is your blood pressure. If the pressure is too high, it is called hypertension (HTN) or high blood pressure. HTN is dangerous because you may have it and not know it. High blood pressure may mean that your heart has to work harder to pump blood. Your arteries may be narrow or stiff. The extra work puts you at risk for heart disease, stroke, and other problems.  Blood pressure consists of two numbers, a higher number over a lower, 110/72, for  example. It is stated as "110 over 72." The ideal is below 120 for the top number (systolic) and under 80 for the bottom (diastolic).  You should pay close attention to your blood pressure if you have certain conditions such as:  Heart failure.   Prior heart attack.   Diabetes   Chronic kidney disease.   Prior stroke.   Multiple risk factors for heart disease.  To see if you have HTN, your blood pressure should be measured while you are seated with your arm held at the level of the heart. It should be measured at least twice. A one-time elevated blood pressure reading (especially in the Emergency Department) does not mean that you need treatment. There may be conditions in which the blood pressure is different between your right and left arms. It is important to see your caregiver soon for a recheck. Most people have essential hypertension which means that there is not a specific cause. This type of high blood pressure may be lowered by changing lifestyle factors such as:  Stress.   Smoking.  Lack of exercise.   Excessive weight.   Drug/tobacco/alcohol use.   Eating less salt.  Most people do not have symptoms from high blood pressure until it has caused damage to the body. Effective treatment can often prevent, delay or reduce that damage. TREATMENT  Treatment for high blood pressure, when a cause has been identified, is directed at the cause. There are a large number of medications to treat HTN. These fall into several categories, and your caregiver will help you select the medicines that are best for you. Medications may have side effects. You should review side effects with your caregiver. If your blood pressure stays high after you have made lifestyle changes or started on medicines,   Your medication(s) may need to be changed.   Other problems may need to be addressed.   Be certain you understand your prescriptions, and know how and when to take your medicine.   Be sure  to follow up with your caregiver within the time frame advised (usually within two weeks) to have your blood pressure rechecked and to review your medications.   If you are taking more than one medicine to lower your blood pressure, make sure you know how and at what times they should be taken. Taking two medicines at the same time can result in blood pressure that is too low.  Document Released: 10/14/2005 Document Revised: 04/23/2011 Document Reviewed: 10/21/2007 [facility] Patient Information 2012 [place], [place].    RESOURCE GUIDE  Chronic Pain Problems: Contact [facility]  [phone] Patients need to be referred by their primary care doctor.  Insufficient Money for Medicine: Contact United Way:  call "211" or [facility] 865-886-3537.  No Primary Care Doctor: - Call [facility]  [phone] - can help you locate a primary care doctor that  accepts your insurance, provides certain services, etc. - Physician Referral [phone]  Agencies that provide inexpensive medical care: - [facility]  [phone] - [facility]  [phone] - [facility]  [phone] [facility]  [phone] - Planned Parenthood  [phone] [facility]  [phone]  Medicaid-accepting [facility] Providers: - [facility]- [address]  [phone], Mon-Fri 9am-7pm, Sat 9am-1pm - [facility]- [address] [place], [address] [place]  [phone] - [facility]- [address], Suite [place]  [phone] [facility]- [address]  [phone] - Renaye Rakers- [address] [place], [address], [phone]  Only accepts [place] patients after they have their name  applied to their card  Self Pay (no insurance) in [place]: - Sickle Cell Patients: Dr Willey Blade, [facility]  [address] [place],  [phone] - [facility]- [address] [place]  [phone]       [facility] [place]- [address]       -     [facility]- see information above (Speak to [employer] if you do not have insurance)       -  [facility]- [address] [place], [phone]       -  [facility]- [address],  [phone]       -  [facility]- [address] [phone]       -  Dr  Osei-Bonsu-  [address], [place], [phone]       -  [facility]- [address] [phone]       -  [facility]- [address] [phone], also [address] [phone]       -    [facility]- [address] [place], [phone], 1st & 3rd Saturday   every month, 10am-1pm  1) Find a Doctor and Pay Out of Pocket Although you won't have to find out who is covered by your insurance plan, it is a good idea to ask around and get recommendations. You will then need to call the office and see if the doctor you have chosen will accept you as a new patient and what types of options they offer for patients who are self-pay. Some doctors offer discounts or will set up payment plans for their patients who do not have insurance, but you will need to ask so you aren't surprised when you get to your appointment.  2) Contact Your Local Health Department Not all health departments have doctors that can see patients for sick visits, but many do, so it is worth a call to see if yours does. If you don't know where your local health department is, you can check in your phone book. The CDC also has a tool to help you locate your state's health department, and many state websites also have listings of all of their local health departments.  3) Find a Walk-in Clinic If your illness is not likely to be very severe or complicated, you may want to try a walk in clinic. These are popping up all over the country in pharmacies,  drugstores, and shopping centers. They're usually staffed by nurse practitioners or physician assistants that have been trained to treat common illnesses and complaints. They're usually fairly quick and inexpensive. However, if you have serious medical issues or chronic medical problems, these are probably not your best option  STD Testing - [facility] Department of [facility] [place], STD Clinic, [address] [place], phone [phone] or [phone].  Monday - Friday, call for an appointment. [facility] Department of [employer], STD Clinic, [place] [address], [place], phone [phone] or [phone].  Monday - Friday, call for an appointment.  Abuse/Neglect: [facility] Child Abuse Hotline 717-419-3706 [facility] Child Abuse Hotline 331-735-3633 (After Hours)  Emergency Shelter:  [facility] 3206873759  Maternity Homes: - [facility] [place] [facility] 410-172-7227 - [facility] [phone]  MRSA Hotline #:   (873)260-4145  [facility] Resources  [facility]  [employer] [facility] Dept. [address]                 [address]         [address] [place] [address]  [place]                                               Cristobal Goldmann Phone:  [phone]  Phone:  [phone]                   Phone:  [phone]  [place], [phone] - [phone]       -     [facility] [place], [address],                                  262-800-5051, [facility] Child Abuse Hotline 769-440-6144 or [phone] (After Hours)   Behavioral Health Services  Substance Abuse Resources: - [facility] Drug Services  (910)230-3222 - [facility]  ([phone] - [facility] [phone] Floydene Flock 8651695206 - Residential & Outpatient Substance Abuse Program  ([phone]  Psychological Services: [facility]  ([phone] [facility]  [phone] - [facility], [phone] [place]. [address], [place], ACCESS LINE: 2536600087 or 818-499-0850, [employer]  Dental Assistance  If unable to pay or uninsured, contact:  [facility] or [facility] to become qualified for the adult dental clinic.  Patients with Medicaid: [facility] [phone] [address], [phone] [address] [phone]  If unable to pay, or uninsured, contact [facility] (479)883-4173) or [facility] Department [phone] in [place], [phone] in [facility]) to become qualified for the adult dental clinic  Other Low-Cost Community Dental Services: - [facility]- [address] [employer], [place], [place], [address], [phone], [address], 2nd and 4th Thursday of the month at 6:30am.  10 clients each day by appointment, can sometimes see walk-in patients if someone does not show for an appointment. [facility]- [address] [place], [place], [address], [phone] - [facility]- [address] [place], [place], [address], [phone] - [facility]- [phone] [facility] Health Department- [phone] [facility] Department- [phone]

## 2012-02-18 NOTE — ED Provider Notes (Signed)
Medical screening examination/treatment/procedure(s) were performed by non-physician practitioner and as supervising physician I was immediately available for consultation/collaboration.  Elisavet Buehrer M Janeese Mcgloin, MD 02/18/12 0712 

## 2012-06-04 IMAGING — CR DG CHEST 2V
2 series · 2 of 2 positions shown · non-contrast
Comparison: 02/15/2008.

CLINICAL DATA: History of chest pain.  Shortness of breath.  Morbid
obesity.

CHEST - 2 VIEW

[w chest pa]
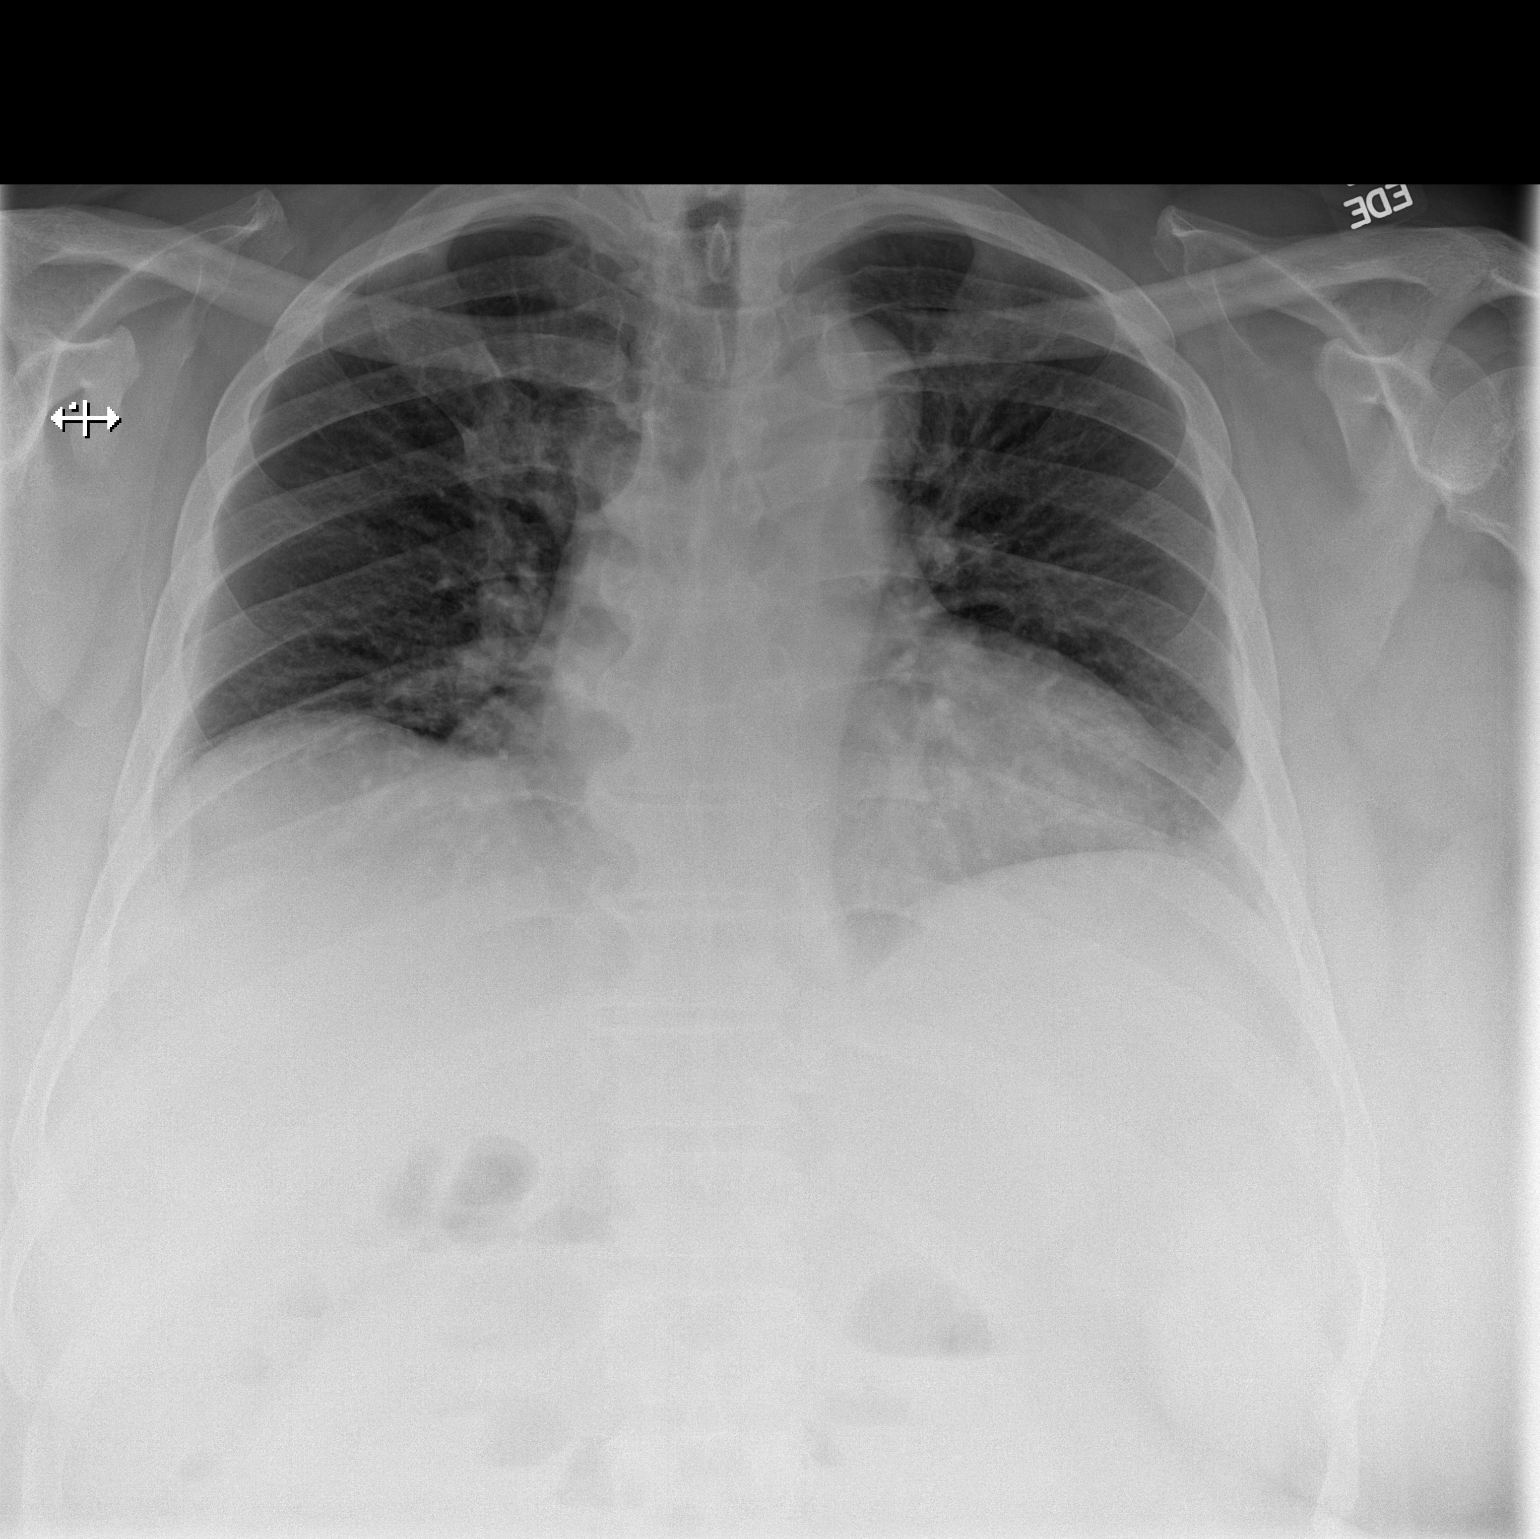

[w chest lat]
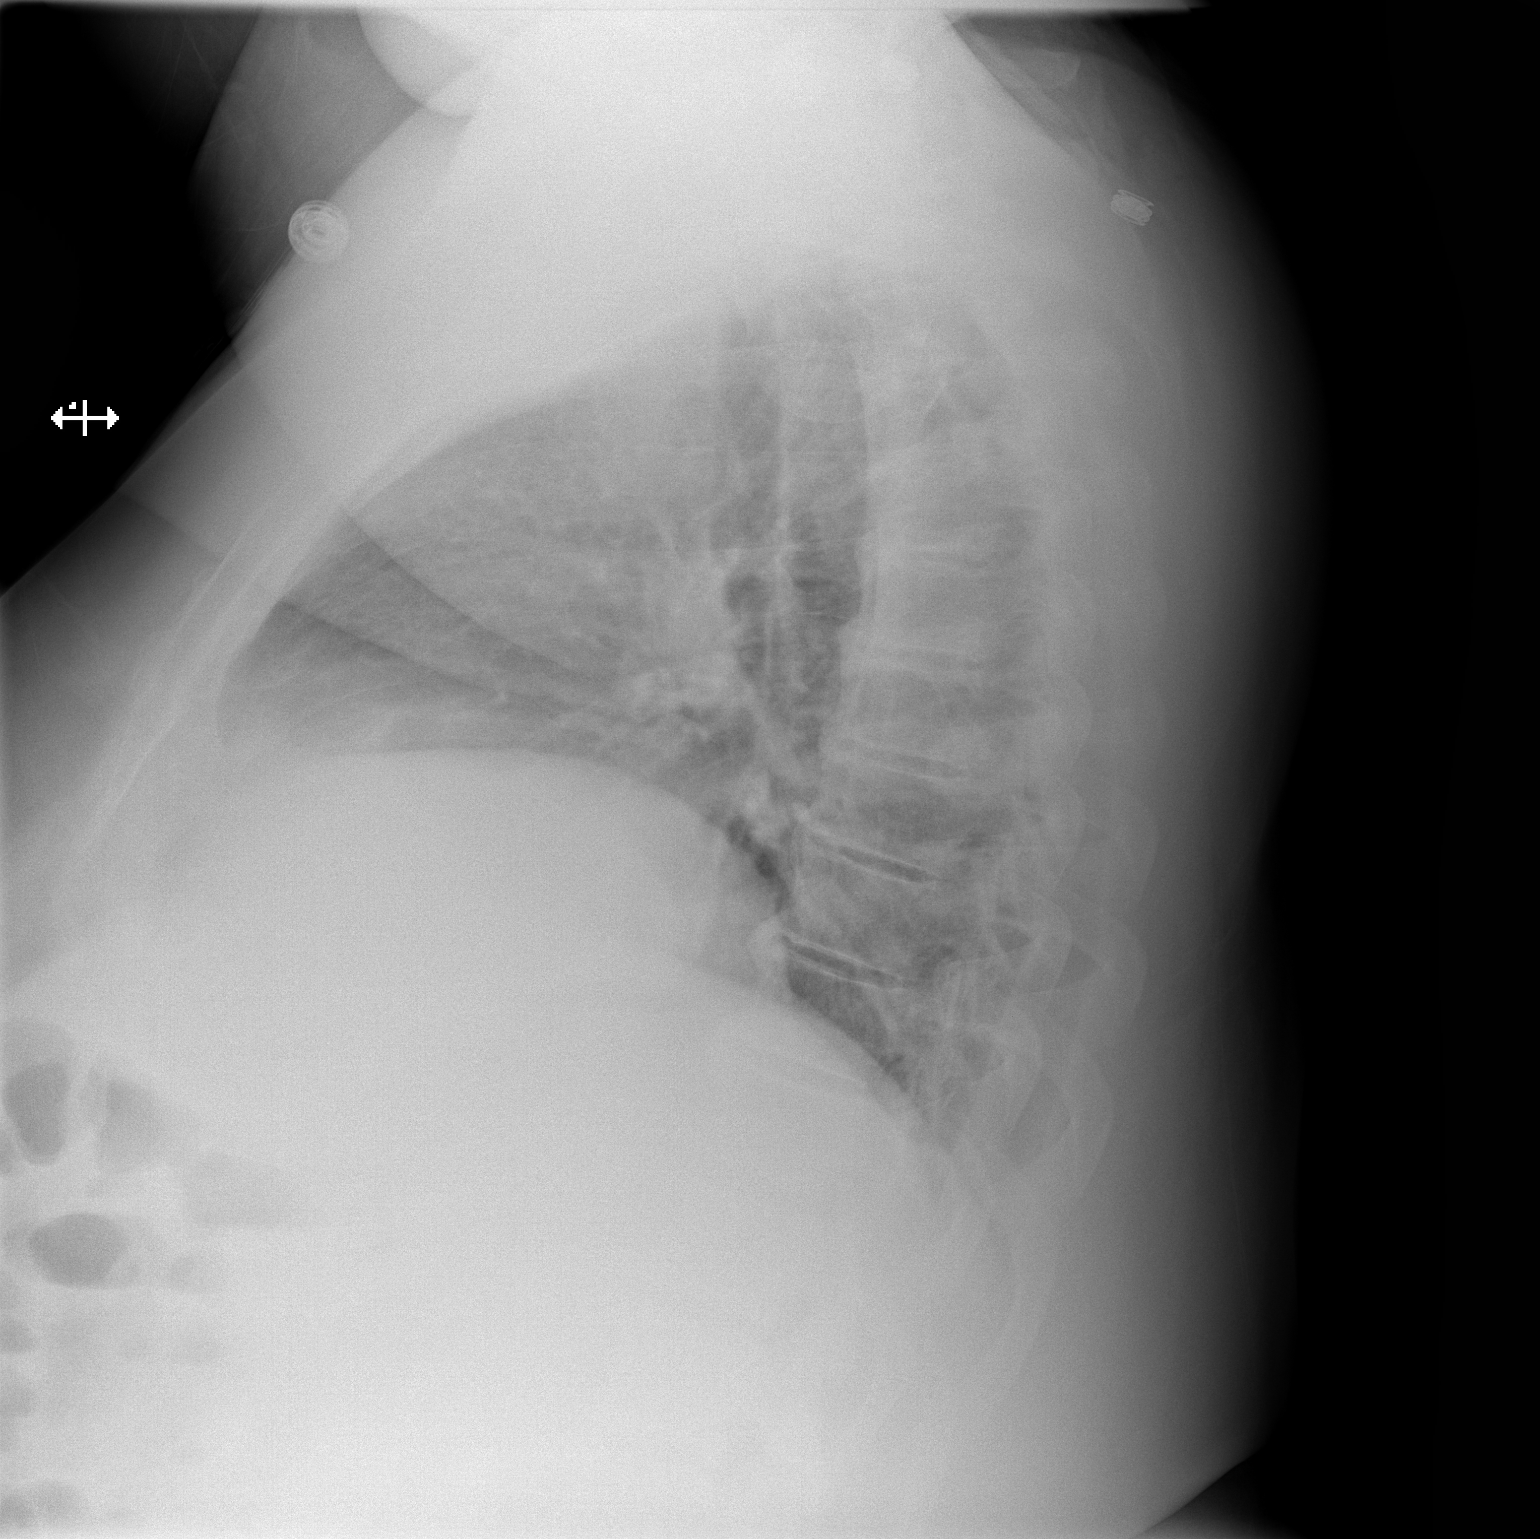

[2 of 2 positions shown; findings below may reference images not displayed]

FINDINGS: There is slight enlargement of the cardiac silhouette.
Ectasia of thoracic aorta appears stable.  There is elevation of
the right hemidiaphragm anteriorly.  No pulmonary edema, pneumonia,
pleural effusion, or pneumothorax is seen.  Osteophytes are present
in the spine.
IMPRESSION: Slight enlargement cardiac silhouette.  No pulmonary edema,
pneumonia, or pleural effusion.  Obesity.  Low lung volumes.

## 2012-12-16 ENCOUNTER — Emergency Department (HOSPITAL_COMMUNITY)
Admission: EM | Admit: 2012-12-16 | Discharge: 2012-12-16 | Disposition: A | Discharge status: Home / Self Care | Payer: Self-pay | Attending: Emergency Medicine | Admitting: Emergency Medicine

## 2012-12-16 ENCOUNTER — Encounter (HOSPITAL_COMMUNITY): Payer: Self-pay

## 2012-12-16 DIAGNOSIS — M549 Dorsalgia, unspecified: Secondary | ICD-10-CM | POA: Insufficient documentation

## 2012-12-16 DIAGNOSIS — Z8719 Personal history of other diseases of the digestive system: Secondary | ICD-10-CM | POA: Insufficient documentation

## 2012-12-16 DIAGNOSIS — M25569 Pain in unspecified knee: Secondary | ICD-10-CM | POA: Insufficient documentation

## 2012-12-16 DIAGNOSIS — M25561 Pain in right knee: Secondary | ICD-10-CM

## 2012-12-16 DIAGNOSIS — Z8739 Personal history of other diseases of the musculoskeletal system and connective tissue: Secondary | ICD-10-CM | POA: Insufficient documentation

## 2012-12-16 DIAGNOSIS — G8929 Other chronic pain: Secondary | ICD-10-CM | POA: Insufficient documentation

## 2012-12-16 DIAGNOSIS — I10 Essential (primary) hypertension: Secondary | ICD-10-CM | POA: Insufficient documentation

## 2012-12-16 DIAGNOSIS — Z79899 Other long term (current) drug therapy: Secondary | ICD-10-CM | POA: Insufficient documentation

## 2012-12-16 HISTORY — DX: Gastro-esophageal reflux disease without esophagitis: K21.9

## 2012-12-16 HISTORY — DX: Unspecified osteoarthritis, unspecified site: M19.90

## 2012-12-16 MED ORDER — IBUPROFEN 800 MG PO TABS: 800.0000 mg | ORAL_TABLET | Freq: Three times a day (TID) | ORAL | Status: DC

## 2012-12-16 MED ORDER — OXYCODONE-ACETAMINOPHEN 5-325 MG PO TABS
ORAL_TABLET | ORAL | Status: DC
Start: 2012-12-16 — End: 2013-05-04

## 2012-12-16 MED ORDER — LISINOPRIL 10 MG PO TABS: 20.0000 mg | ORAL_TABLET | Freq: Every day | ORAL | Status: DC

## 2012-12-16 NOTE — ED Provider Notes (Signed)
Medical screening examination/treatment/procedure(s) were performed by non-physician practitioner and as supervising physician I was immediately available for consultation/collaboration.  Lyanne Co, MD 12/16/12 1400

## 2012-12-16 NOTE — ED Notes (Signed)
Patient complains of chronic knee pain and chronic back pain.  Patient asked for cortisone shot in his R knee.  Patient claims no insurance and wanted to get it taken care of here.  Patient advised "I take tramadol for pain most the time, but it's my wife's."    Denied taking anything for the pain today.

## 2012-12-16 NOTE — ED Provider Notes (Signed)
History     CSN: [id number]  Arrival date & time 12/16/12  1101   First MD Initiated Contact with Patient 12/16/12 1144      Chief Complaint  Patient presents with  . Knee Pain  . Back Pain    (Consider location/radiation/quality/duration/timing/severity/associated sxs/prior treatment) Patient is a 62 y.o. male presenting with knee pain and back pain. The history is provided by the patient. No language interpreter was used.  Knee Pain Associated symptoms: back pain   Associated symptoms: no fever   Back Pain Associated symptoms: no fever   Pt c/o right knee and lower back pain that has progressively worsened over past few days.  Reports he has chronic back and knee pain.  Has had cortisone injections in his knee several years ago, which did help.  Pain right now is achy, starting in his right buttocks and a sharp throbbing sensation that goes down lateral thigh, stops at his knee.  Right knee is throbbing.  Pt states he has had some of his wife's ultram for pain with mild-moderate relief.  Denies numbness or tingling in groin.  No loss of bowel or bladder.  No fever, n/v/d.  No recent trauma. Pt states he uses a cane to help him ambulate but he can ambulate w/o one.  Past Medical History  Diagnosis Date  . Hypertension   . GERD (gastroesophageal reflux disease)   . Arthritis     Past Surgical History  Procedure Laterality Date  . Hernia repair      No family history on file.  History  Substance Use Topics  . Smoking status: Never Smoker   . Smokeless tobacco: Not on file  . Alcohol Use: No      Review of Systems  Constitutional: Negative for fever and chills.  Musculoskeletal: Positive for back pain and arthralgias.    Allergies  Review of patient's allergies indicates no known allergies.  Home Medications   Current Outpatient Rx  Name  Route  Sig  Dispense  Refill  . escitalopram (LEXAPRO) 10 MG tablet   Oral   Take 10 mg by mouth daily.         .  traMADol (ULTRAM) 50 MG tablet   Oral   Take 50 mg by mouth 2 (two) times daily.         Marland Kitchen ibuprofen (ADVIL,MOTRIN) 800 MG tablet   Oral   Take 1 tablet (800 mg total) by mouth 3 (three) times daily.   21 tablet   0   . lisinopril (PRINIVIL,ZESTRIL) 10 MG tablet   Oral   Take 2 tablets (20 mg total) by mouth daily.   30 tablet   0   . oxyCODONE-acetaminophen (PERCOCET/ROXICET) 5-325 MG per tablet      Take 1-2 tablets every 4-6hrs as needed for pain   6 tablet   0     BP 181/86  Pulse 70  Temp(Src) 97.6 F (36.4 C) (Oral)  Resp 18  Ht 5\' 7"  (1.702 m)  Wt 400 lb (181.439 kg)  BMI 62.63 kg/m2  SpO2 100%  Physical Exam  Nursing note and vitals reviewed. Constitutional: He is oriented to person, place, and time. He appears well-developed and well-nourished.  Morbidly obese male sitting on side of bed with right leg hanging over the side  HENT:  Head: Normocephalic and atraumatic.  Eyes: Conjunctivae are normal. No scleral icterus.  Neck: Normal range of motion. Neck supple.  No midline cervical tenderness, no meninges. FROM  Cardiovascular: Normal rate, regular rhythm and normal heart sounds.   Pulmonary/Chest: Effort normal and breath sounds normal. No respiratory distress. He has no wheezes.  Abdominal: Soft. Bowel sounds are normal. He exhibits no distension. There is no tenderness.  Musculoskeletal: He exhibits tenderness ( right sciatic notch and buttocks ). He exhibits no edema.       Arms: Neurological: He is alert and oriented to person, place, and time. He has normal strength. No cranial nerve deficit or sensory deficit. He displays a negative Romberg sign. Gait ( antalgic (uses cane) ) abnormal. Coordination normal.  Skin: Skin is warm and dry.    ED Course  Procedures (including critical care time)  Labs Reviewed - No data to display No results found.   1. Back pain   2. Right knee pain       MDM  Pt c/o exacerbation of chronic back and right  knee pain.  Denies recent trauma.  Denies fever, n/v/d,  No saddle paresthesia, loss of bowel or bladder.    Believe this is exacerbation of pt's chronic pain.  Rx: percocet, ibuprofen, and lisinopril.  Advised pt that he needs to establish primary care for chronic pain as well as health maintenance such as his HTN. Also instructed pt to f/u with urgent care or PCP for medication refills.    Referred to Dr. Shelle Iron, orthopedist, for chronic knee and back pain management.  Pt reports having steroid injections in the past, which did help.   Discussed with pt, weight loss will also help reduce back and knee pain.  Vitals: unremarkable. Discharged in stable condition.    Discussed pt with attending during ED encounter.      Junius Finner, PA-C 12/16/12 1309

## 2012-12-16 NOTE — ED Notes (Signed)
Pt presents with right knee pain and back pain. Pt states this pain has been going on for years but yesterday it started getting worse.

## 2013-05-04 ENCOUNTER — Ambulatory Visit: Payer: Self-pay | Attending: Internal Medicine | Admitting: Internal Medicine

## 2013-05-04 ENCOUNTER — Encounter: Payer: Self-pay | Admitting: Internal Medicine

## 2013-05-04 VITALS — BP 181/122 | HR 78 | Temp 98.1°F | Resp 14 | Ht 67.32 in | Wt >= 6400 oz

## 2013-05-04 DIAGNOSIS — M1990 Unspecified osteoarthritis, unspecified site: Secondary | ICD-10-CM | POA: Insufficient documentation

## 2013-05-04 DIAGNOSIS — F32A Depression, unspecified: Secondary | ICD-10-CM | POA: Insufficient documentation

## 2013-05-04 DIAGNOSIS — E119 Type 2 diabetes mellitus without complications: Secondary | ICD-10-CM

## 2013-05-04 DIAGNOSIS — F3289 Other specified depressive episodes: Secondary | ICD-10-CM | POA: Insufficient documentation

## 2013-05-04 DIAGNOSIS — F329 Major depressive disorder, single episode, unspecified: Secondary | ICD-10-CM | POA: Insufficient documentation

## 2013-05-04 DIAGNOSIS — M129 Arthropathy, unspecified: Secondary | ICD-10-CM

## 2013-05-04 DIAGNOSIS — K219 Gastro-esophageal reflux disease without esophagitis: Secondary | ICD-10-CM | POA: Insufficient documentation

## 2013-05-04 DIAGNOSIS — Z0000 Encounter for general adult medical examination without abnormal findings: Secondary | ICD-10-CM | POA: Insufficient documentation

## 2013-05-04 DIAGNOSIS — I10 Essential (primary) hypertension: Secondary | ICD-10-CM | POA: Insufficient documentation

## 2013-05-04 LAB — LIPID PANEL
Cholesterol: 231 mg/dL — ABNORMAL HIGH (ref 0–200)
HDL: 47 mg/dL (ref >39)
LDL Cholesterol: 165 mg/dL — ABNORMAL HIGH (ref 0–99)
Total CHOL/HDL Ratio: 4.9 Ratio
Triglycerides: 94 mg/dL (ref <150)
VLDL: 19 mg/dL (ref 0–40)

## 2013-05-04 MED ORDER — ESOMEPRAZOLE MAGNESIUM 40 MG PO CPDR: 40.0000 mg | DELAYED_RELEASE_CAPSULE | Freq: Every day | ORAL | Status: AC

## 2013-05-04 MED ORDER — ESCITALOPRAM OXALATE 10 MG PO TABS: 10.0000 mg | ORAL_TABLET | Freq: Every day | ORAL | Status: DC

## 2013-05-04 MED ORDER — TRAMADOL HCL 50 MG PO TABS: 100.0000 mg | ORAL_TABLET | Freq: Two times a day (BID) | ORAL | Status: DC

## 2013-05-04 MED ORDER — LISINOPRIL-HYDROCHLOROTHIAZIDE 20-25 MG PO TABS: 1.0000 | ORAL_TABLET | Freq: Every day | ORAL | Status: DC

## 2013-05-04 MED ORDER — TRAMADOL HCL 50 MG PO TABS: 100.0000 mg | ORAL_TABLET | Freq: Four times a day (QID) | ORAL | Status: DC | PRN

## 2013-05-04 NOTE — Patient Instructions (Addendum)

## 2013-05-04 NOTE — Progress Notes (Signed)
Patient ID: [name], male   DOB: [birth year], 62 y.o.   MRN: [id number]  CC: Establish care  HPI: 62 year old male with history of hypertension, arthritis, acid reflux but does not take medications for presented to our clinic to establish primary care physician. Patient presents with generalized pain especially in his joints knees and elbows, morning stiffness for about few hours prior to starting feeling better. Patient reports no fevers or chills, no swelling in joints. No difficulty ambulation. He has no complaints of abdominal pain, nausea or vomiting but he does have a feeling of something being stuck in the middle of the esophagus. He takes of counter Pepcid but he reports no significant relief.  No Known Allergies Past Medical History  Diagnosis Date  . Hypertension   . GERD (gastroesophageal reflux disease)   . Arthritis    No current outpatient prescriptions on file prior to visit.   No current facility-administered medications on file prior to visit.   Family History  Problem Relation Age of Onset  . Hypertension Mother   . Cancer Father   . Cancer Brother   . Cancer Daughter    History   Social History  . Marital Status: Married    Spouse Name: N/A    Number of Children: N/A  . Years of Education: N/A   Occupational History  . Not on file.   Social History Main Topics  . Smoking status: Never Smoker   . Smokeless tobacco: Not on file  . Alcohol Use: No  . Drug Use: No  . Sexual Activity: Not on file   Other Topics Concern  . Not on file   Social History Narrative  . No narrative on file    Review of Systems  Constitutional: Negative for fever, chills, diaphoresis, activity change, appetite change and fatigue.  HENT: Negative for ear pain, nosebleeds, congestion, facial swelling, rhinorrhea, neck pain, neck stiffness and ear discharge.   Eyes: Negative for pain, discharge, redness, itching and visual disturbance.  Respiratory: Negative for cough,  choking, chest tightness, shortness of breath, wheezing and stridor.   Cardiovascular: Negative for chest pain, palpitations and leg swelling.  Gastrointestinal: Negative for abdominal distention.  positive for indigestion, feeling that something is stuck in the middle of the is also close Genitourinary: Negative for dysuria, urgency, frequency, hematuria, flank pain, decreased urine volume, difficulty urinating and dyspareunia.  Musculoskeletal: Positive for back pain, negative joint swelling, positive for arthralgias  Neurological: Negative for dizziness, tremors, seizures, syncope, facial asymmetry, speech difficulty, weakness, light-headedness, numbness and headaches.  Hematological: Negative for adenopathy. Does not bruise/bleed easily.  Psychiatric/Behavioral: Negative for hallucinations, behavioral problems, confusion, dysphoric mood, decreased concentration and agitation.    Objective:   Filed Vitals:   05/04/13 1041  BP: 181/122  Pulse: 78  Temp: 98.1 F (36.7 C)  Resp: 14    Physical Exam  Constitutional: Appears well-developed and well-nourished. No distress.  morbidly obese HENT: Normocephalic. External right and left ear normal. Oropharynx is clear and moist.  Eyes: Conjunctivae and EOM are normal. PERRLA, no scleral icterus.  Neck: Normal ROM. Neck supple. No JVD. No tracheal deviation. No thyromegaly.  CVS: RRR, S1/S2 +, no murmurs, no gallops, no carotid bruit.  Pulmonary: Effort and breath sounds normal, no stridor, rhonchi, wheezes, rales.  Abdominal: Soft. BS +,  no distension, tenderness, rebound or guarding.  Musculoskeletal: Normal range of motion. No edema and no tenderness.  Lymphadenopathy: No lymphadenopathy noted, cervical, inguinal. Neuro: Alert. Normal reflexes, muscle tone  coordination. No cranial nerve deficit. Skin: Skin is warm and dry. No rash noted. Not diaphoretic. No erythema. No pallor.  Psychiatric: Normal mood and affect. Behavior, judgment,  thought content normal.   Lab Results  Component Value Date   WBC 5.4 03/06/2011   HGB 15.3 03/06/2011   HCT 45.0 03/06/2011   MCV 74.3* 03/06/2011   PLT 209 03/06/2011   Lab Results  Component Value Date   CREATININE 1.30 03/06/2011   BUN 12 03/06/2011   NA 143 03/06/2011   K 3.9 03/06/2011   CL 109 03/06/2011   CO2 24 02/15/2008    Lab Results  Component Value Date   HGBA1C  Value: 5.8 (NOTE)   The ADA recommends the following therapeutic goal for glycemic   control related to Hgb A1C measurement:   Goal of Therapy:   < 7.0% Hgb A1C   Reference: American Diabetes Association: Clinical Practice   Recommendations 2008, Diabetes Care,  2008, 31:(Suppl 1). 02/15/2008   Lipid Panel     Component Value Date/Time   CHOL  Value: 214        ATP III CLASSIFICATION:  <200     mg/dL   Desirable  161-096  mg/dL   Borderline High  >=045    mg/dL   High* 12/01/8117 1478   TRIG 153* 02/16/2008 0140   HDL 38* 02/16/2008 0140   CHOLHDL 5.6 02/16/2008 0140   VLDL 31 02/16/2008 0140   LDLCALC  Value: 145        Total Cholesterol/HDL:CHD Risk Coronary Heart Disease Risk Table                     Men   Women  1/2 Average Risk   3.4   3.3* 02/16/2008 0140       Assessment and plan:   Patient Active Problem List   Diagnosis Date Noted  . HTN (hypertension) 05/04/2013    Priority: High - We have discussed target BP range - I have advised pt to check BP regularly and to call us back if the numbers are higher than 140/90 - discussed the importance of compliance with medical therapy and diet  - Prescription provided for lisinopril/HCTZ 20/25 mg daily - pt instructed to come back in 1 month for re-eval of BP   . Arthritis 05/04/2013    Priority: High - Prescription provided for Ultram 200 mg as needed every 6 hours   . Preventative health care 05/04/2013    Priority: Medium - Will check A1c, lipid panel and TSH   . GERD (gastroesophageal reflux disease) 05/04/2013    Priority: Medium  . Depression  05/04/2013    Priority: Medium     GERD - start nexium 40 mg daily - referral to GI     Depression - lexapro 10 mg daily

## 2013-05-04 NOTE — Progress Notes (Signed)
Pt is here to establish care. Pt reports of having a hiatal hernia. Which is causing chest pain, tightness and mostly hurts when he is moving around or twist the wrong way. Being active causes pain. 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 He also says that liquids and food gets stuck in his throat several times a week.

## 2013-05-06 ENCOUNTER — Telehealth: Payer: Self-pay | Admitting: Emergency Medicine

## 2013-05-06 NOTE — Telephone Encounter (Signed)
PT CALLING FOR LAB RESULTS. CHOLESTEROL LEVEL ELEVATED  NO DOCTOR REPORT READ. INFORMED PT TO CONTROL DIET/EXERCISE. WE WILL CALL IF NEW MEDICATION ORDERED

## 2013-05-30 ENCOUNTER — Other Ambulatory Visit: Payer: Self-pay | Admitting: Internal Medicine

## 2013-05-30 NOTE — Telephone Encounter (Signed)
Refill medication - ultram

## 2013-06-03 ENCOUNTER — Ambulatory Visit: Payer: No Typology Code available for payment source | Attending: Internal Medicine | Admitting: Internal Medicine

## 2013-06-03 VITALS — BP 138/84 | HR 76 | Temp 98.4°F | Resp 16

## 2013-06-03 DIAGNOSIS — I10 Essential (primary) hypertension: Secondary | ICD-10-CM | POA: Insufficient documentation

## 2013-06-03 DIAGNOSIS — K219 Gastro-esophageal reflux disease without esophagitis: Secondary | ICD-10-CM | POA: Insufficient documentation

## 2013-06-03 MED ORDER — TRAMADOL HCL 50 MG PO TABS: 100.0000 mg | ORAL_TABLET | Freq: Four times a day (QID) | ORAL | Status: AC | PRN

## 2013-06-03 NOTE — Patient Instructions (Signed)

## 2013-06-03 NOTE — Progress Notes (Signed)
Patient ID: [name], male   DOB: 08-31-1950, 62 y.o.   MRN: [id number]  CC:  Regular followup  HPI:  Patient is 62 year old male with history of hypertension and acid reflux who presents to clinic for followup. He denies chest pain or shortness of breath, no recent sicknesses or hospitalizations. He reports compliance with medications. He needs refills on his medicines.  No Known Allergies Past Medical History  Diagnosis Date  . Hypertension   . GERD (gastroesophageal reflux disease)   . Arthritis    Current Outpatient Prescriptions on File Prior to Visit  Medication Sig Dispense Refill  . escitalopram (LEXAPRO) 10 MG tablet Take 1 tablet (10 mg total) by mouth daily.  30 tablet  3  . esomeprazole (NEXIUM) 40 MG capsule Take 1 capsule (40 mg total) by mouth daily.  30 capsule  3  . lisinopril-hydrochlorothiazide (PRINZIDE,ZESTORETIC) 20-25 MG per tablet Take 1 tablet by mouth daily.  30 tablet  3   No current facility-administered medications on file prior to visit.   Family History  Problem Relation Age of Onset  . Hypertension Mother   . Cancer Father   . Cancer Brother   . Cancer Daughter    History   Social History  . Marital Status: Married    Spouse Name: N/A    Number of Children: N/A  . Years of Education: N/A   Occupational History  . Not on file.   Social History Main Topics  . Smoking status: Never Smoker   . Smokeless tobacco: Not on file  . Alcohol Use: No  . Drug Use: No  . Sexual Activity: Not on file   Other Topics Concern  . Not on file   Social History Narrative  . No narrative on file    Review of Systems  Constitutional: Negative for fever, chills, diaphoresis, activity change, appetite change and fatigue.  HENT: Negative for ear pain, nosebleeds, congestion, facial swelling, rhinorrhea, neck pain, neck stiffness and ear discharge.   Eyes: Negative for pain, discharge, redness, itching and visual disturbance.  Respiratory: Negative for  cough, choking, chest tightness, shortness of breath, wheezing and stridor.   Cardiovascular: Negative for chest pain, palpitations and leg swelling.  Gastrointestinal: Negative for abdominal distention.  Genitourinary: Negative for dysuria, urgency, frequency, hematuria, flank pain, decreased urine volume, difficulty urinating and dyspareunia.  Musculoskeletal: Negative for back pain, joint swelling, arthralgias and gait problem.  Neurological: Negative for dizziness, tremors, seizures, syncope, facial asymmetry, speech difficulty, weakness, light-headedness, numbness and headaches.  Hematological: Negative for adenopathy. Does not bruise/bleed easily.  Psychiatric/Behavioral: Negative for hallucinations, behavioral problems, confusion, dysphoric mood, decreased concentration and agitation.    Objective:   Filed Vitals:   06/03/13 1010  BP: 138/84  Pulse: 76  Temp: 98.4 F (36.9 C)  Resp: 16    Physical Exam  Constitutional: Appears well-developed and well-nourished. No distress.  CVS: RRR, S1/S2 +, no murmurs, no gallops, no carotid bruit.  Pulmonary: Effort and breath sounds normal, no stridor, rhonchi, wheezes, rales.  Abdominal: Soft. BS +,  no distension, tenderness, rebound or guarding.  Musculoskeletal: Normal range of motion. No edema and no tenderness.   Lab Results  Component Value Date   WBC 5.4 03/06/2011   HGB 15.3 03/06/2011   HCT 45.0 03/06/2011   MCV 74.3* 03/06/2011   PLT 209 03/06/2011   Lab Results  Component Value Date   CREATININE 1.30 03/06/2011   BUN 12 03/06/2011   NA 143 03/06/2011  K 3.9 03/06/2011   CL 109 03/06/2011   CO2 24 02/15/2008    Lab Results  Component Value Date   HGBA1C  Value: 5.8 (NOTE)   The ADA recommends the following therapeutic goal for glycemic   control related to Hgb A1C measurement:   Goal of Therapy:   < 7.0% Hgb A1C   Reference: American Diabetes Association: Clinical Practice   Recommendations 2008, Diabetes Care,  2008,  31:(Suppl 1). 02/15/2008   Lipid Panel     Component Value Date/Time   CHOL 231* 05/04/2013 1053   TRIG 94 05/04/2013 1053   HDL 47 05/04/2013 1053   CHOLHDL 4.9 05/04/2013 1053   VLDL 19 05/04/2013 1053   LDLCALC 165* 05/04/2013 1053       Assessment and plan:   Patient Active Problem List   Diagnosis Date Noted  . HTN (hypertension) reasonable control, I have advised patient to call his back if the numbers are higher than 140/90 so we can readjust her regimen -  05/04/2013  . Arthritis 05/04/2013  . GERD (gastroesophageal reflux disease) - continue same regimen, diet discussed in detail  05/04/2013  . Depression -stable 05/04/2013

## 2013-06-03 NOTE — Progress Notes (Signed)
Follow up on his HTN Recently changed his BP medications

## 2013-06-29 ENCOUNTER — Encounter: Payer: Self-pay | Admitting: Internal Medicine

## 2013-08-26 ENCOUNTER — Other Ambulatory Visit: Payer: Self-pay | Admitting: Internal Medicine

## 2013-08-29 ENCOUNTER — Other Ambulatory Visit: Payer: Self-pay | Admitting: Internal Medicine

## 2013-09-05 ENCOUNTER — Ambulatory Visit: Payer: Self-pay

## 2013-12-14 ENCOUNTER — Ambulatory Visit: Payer: Self-pay | Attending: Internal Medicine

## 2013-12-30 ENCOUNTER — Other Ambulatory Visit: Payer: Self-pay | Admitting: Internal Medicine

## 2014-02-06 ENCOUNTER — Ambulatory Visit: Payer: Self-pay | Admitting: Internal Medicine

## 2019-11-10 ENCOUNTER — Ambulatory Visit: Payer: Medicare HMO | Attending: Internal Medicine

## 2019-11-10 DIAGNOSIS — Z23 Encounter for immunization: Secondary | ICD-10-CM

## 2019-11-10 NOTE — Progress Notes (Signed)
   Covid-19 Vaccination Clinic  Name:  [name]    MRN: [id number] DOB: 20-Oct-1950  11/10/2019  [name] was observed post Covid-19 immunization for 15 minutes without incident. He was provided with Vaccine Information Sheet and instruction to access the V-Safe system.   [name] was instructed to call 911 with any severe reactions post vaccine: Marland Kitchen Difficulty breathing  . Swelling of face and throat  . A fast heartbeat  . A bad rash all over body  . Dizziness and weakness   Immunizations Administered    Name Date Dose VIS Date Route   Pfizer COVID-19 Vaccine 11/10/2019  1:18 PM 0.3 mL 08/05/2019 Intramuscular   Manufacturer: [employer], [employer]   Lot: [id number]   NDC: [address]-1

## 2019-12-05 ENCOUNTER — Ambulatory Visit: Payer: Medicare HMO | Attending: Internal Medicine

## 2019-12-05 DIAGNOSIS — Z23 Encounter for immunization: Secondary | ICD-10-CM

## 2019-12-05 NOTE — Progress Notes (Signed)
   Covid-19 Vaccination Clinic  Name:  [name]    MRN: [id number] DOB: 22-Feb-1951  12/05/2019  [name] was observed post Covid-19 immunization for 15 minutes without incident. He was provided with Vaccine Information Sheet and instruction to access the V-Safe system.   [name] was instructed to call 911 with any severe reactions post vaccine: Marland Kitchen Difficulty breathing  . Swelling of face and throat  . A fast heartbeat  . A bad rash all over body  . Dizziness and weakness   Immunizations Administered    Name Date Dose VIS Date Route   Pfizer COVID-19 Vaccine 12/05/2019  2:38 PM 0.3 mL 08/05/2019 Intramuscular   Manufacturer: [employer], [employer]   Lot: [id number]   NDC: [address]-4
